# Patient Record
Sex: Female | Born: 1960 | Race: White | Hispanic: No
Health system: Midwestern US, Academic
[De-identification: ages and names within clinical notes are randomized; demographics above are authoritative.]

---

## 2017-04-20 ENCOUNTER — Ambulatory Visit: Admit: 2017-04-20 | Discharge: 2017-05-04 | Payer: MEDICAID

## 2017-04-24 ENCOUNTER — Encounter: Admit: 2017-04-24 | Discharge: 2017-04-24 | Payer: MEDICAID

## 2017-04-24 DIAGNOSIS — Z923 Personal history of irradiation: ICD-10-CM

## 2017-04-24 DIAGNOSIS — C3412 Malignant neoplasm of upper lobe, left bronchus or lung: Principal | ICD-10-CM

## 2017-04-24 DIAGNOSIS — J439 Emphysema, unspecified: ICD-10-CM

## 2017-04-24 MED ORDER — IOHEXOL 300 MG IODINE/ML IV SOLN
75 mL | Freq: Once | INTRAVENOUS | 0 refills | Status: CP
Start: 2017-04-24 — End: ?
  Administered 2017-04-24: 14:00:00 75 mL via INTRAVENOUS

## 2017-04-24 MED ORDER — SODIUM CHLORIDE 0.9 % IJ SOLN
50 mL | Freq: Once | INTRAVENOUS | 0 refills | Status: CP
Start: 2017-04-24 — End: ?
  Administered 2017-04-24: 14:00:00 50 mL via INTRAVENOUS

## 2017-04-29 ENCOUNTER — Encounter: Admit: 2017-04-29 | Discharge: 2017-04-29 | Payer: MEDICAID

## 2017-04-29 DIAGNOSIS — C3490 Malignant neoplasm of unspecified part of unspecified bronchus or lung: Principal | ICD-10-CM

## 2017-04-30 ENCOUNTER — Ambulatory Visit: Admit: 2017-04-30 | Discharge: 2017-05-01 | Payer: MEDICAID

## 2017-04-30 ENCOUNTER — Encounter: Admit: 2017-04-30 | Discharge: 2017-04-30 | Payer: MEDICAID

## 2017-04-30 DIAGNOSIS — H903 Sensorineural hearing loss, bilateral: Principal | ICD-10-CM

## 2017-04-30 DIAGNOSIS — C3490 Malignant neoplasm of unspecified part of unspecified bronchus or lung: ICD-10-CM

## 2017-04-30 DIAGNOSIS — H9190 Unspecified hearing loss, unspecified ear: ICD-10-CM

## 2017-04-30 DIAGNOSIS — M549 Dorsalgia, unspecified: ICD-10-CM

## 2017-04-30 DIAGNOSIS — N2889 Other specified disorders of kidney and ureter: ICD-10-CM

## 2017-04-30 DIAGNOSIS — R51 Headache: ICD-10-CM

## 2017-04-30 DIAGNOSIS — I10 Essential (primary) hypertension: Secondary | ICD-10-CM

## 2017-04-30 DIAGNOSIS — J9611 Chronic respiratory failure with hypoxia: ICD-10-CM

## 2017-04-30 DIAGNOSIS — J449 Chronic obstructive pulmonary disease, unspecified: ICD-10-CM

## 2017-04-30 DIAGNOSIS — G960 Cerebrospinal fluid leak: ICD-10-CM

## 2017-04-30 DIAGNOSIS — M1990 Unspecified osteoarthritis, unspecified site: ICD-10-CM

## 2017-04-30 DIAGNOSIS — G4730 Sleep apnea, unspecified: ICD-10-CM

## 2017-04-30 DIAGNOSIS — H9211 Otorrhea, right ear: ICD-10-CM

## 2017-04-30 DIAGNOSIS — J019 Acute sinusitis, unspecified: ICD-10-CM

## 2017-04-30 DIAGNOSIS — Z720 Tobacco use: ICD-10-CM

## 2017-04-30 DIAGNOSIS — F329 Major depressive disorder, single episode, unspecified: ICD-10-CM

## 2017-04-30 DIAGNOSIS — H9393 Unspecified disorder of ear, bilateral: ICD-10-CM

## 2017-04-30 DIAGNOSIS — Z923 Personal history of irradiation: ICD-10-CM

## 2017-04-30 DIAGNOSIS — H6983 Other specified disorders of Eustachian tube, bilateral: Principal | ICD-10-CM

## 2017-04-30 DIAGNOSIS — J45909 Unspecified asthma, uncomplicated: ICD-10-CM

## 2017-04-30 DIAGNOSIS — D3502 Benign neoplasm of left adrenal gland: ICD-10-CM

## 2017-04-30 DIAGNOSIS — E119 Type 2 diabetes mellitus without complications: ICD-10-CM

## 2017-04-30 DIAGNOSIS — H9011 Conductive hearing loss, unilateral, right ear, with unrestricted hearing on the contralateral side: ICD-10-CM

## 2017-04-30 MED ORDER — CIPROFLOXACIN-HYDROCORTISONE 0.2-1 % OT DRPS
3 [drp] | Freq: Three times a day (TID) | OTIC | 3 refills | 15.00000 days | Status: AC
Start: 2017-04-30 — End: ?

## 2017-04-30 NOTE — Progress Notes
chief complaint           HPI:  Paige Duran is a 56 y.o. 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  She has new complaints today of fullness and decreased hearing in the right ear.     Paige Duran is s/p right ethmoid CSF leak repair, nasal cavity free mucosal graft, and septoplasty on 12/05/16. She had sinus surgery in [place], [place] in summer 2017. 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 She is in remission.     Today her symptoms include aural fullness.     She is doing nasal saline irrigations less than daily.  No nasal symptoms.  Feels goofd otherwise. No crusting or infection      The following portions of the patient's history were reviewed and updated as appropriate: allergies, current medications, past family history, past medical history, past social history, past surgical history and problem list.    ROS above reviewed      Current Outpatient Prescriptions:   ???  acetaminophen (TYLENOL) 500 mg tablet, Take 1 tablet by mouth every 4 hours as needed. Max of 4,000 mg of acetaminophen in 24 hours., Disp: 40 tablet, Rfl: 0  ???  albuterol (PROAIR HFA) 90 mcg/actuation inhaler, Inhale 2 puffs by mouth into the lungs every 6 hours as needed for Wheezing or Shortness of Breath. Shake well before use., Disp: , Rfl:   ???  ARIPiprazole (ABILIFY) 20 mg tablet, Take 20 mg by mouth daily., Disp: , Rfl:   ???  busPIRone (BUSPAR) 30 mg tablet, Take 1 tablet by mouth twice daily., Disp: , Rfl:   ???  duloxetine DR (CYMBALTA) 60 mg capsule, Take 60 mg by mouth daily., Disp: , Rfl:   ???  hydroCHLOROthiazide (HYDRODIURIL) 25 mg tablet, Take 25 mg by mouth every morning., Disp: , Rfl:   ???  HYDROcodone/acetaminophen(+) (NORCO) 10/325 mg tablet, Take 1 tablet by mouth twice daily Earliest Fill Date: 12/07/16, Disp: 30 tablet, Rfl: 0  ???  loratadine (CLARITIN) 10 mg tablet, Take 10 mg by mouth every morning., Disp: , Rfl: ???  losartan (COZAAR) 50 mg tablet, Take 50 mg by mouth daily., Disp: , Rfl:   ???  metFORMIN (GLUCOPHAGE) 1,000 mg tablet, Take 1,000 mg by mouth twice daily with meals., Disp: , Rfl:   ???  mupirocin (BACTROBAN) 2 % topical ointment, Add inch of ointment to saline rinse bottle and mix well. Irrigate through both nostrils BID for 1 week, Disp: 22 g, Rfl: 3  ???  QUEtiapine (SEROQUEL) 100 mg tablet, Take 300 mg by mouth at bedtime daily. Patient is in the process of titrating up to full dose listed above, Disp: , Rfl:   ???  ranitidine(+) (ZANTAC) 300 mg tablet, Take 300 mg by mouth daily., Disp: , Rfl:   ???  sodium chloride/aloe vera (AYR SALINE) topical gel, Please use every 1-2 hours while awake., Disp: 1 each, Rfl: 12  ???  SYMBICORT 80-4.5 mcg/actuation inhalation, Inhale 2 puffs by mouth into the lungs twice daily., Disp: , Rfl: 5  ???  tiotropium (SPIRIVA) 18 mcg capsule for inhaler, Place 1 capsule into inhaler and inhale into lungs as directed daily., Disp: 30 capsule, Rfl: 11  ???  varenicline (CHANTIX CONTINUING MONTH BOX) 1 mg tablet, Take 1 mg by mouth twice daily., Disp: , Rfl:     General    Vitals:    04/30/17 0818  BP: 138/64   Pulse: 90     General:  Well-developed, well-nourished  Communication and Voice:  Clear pitch and clarity, age appropriate    Head and Face  Inspection:  Normocephalic and atraumatic without masses or lesions  Palpation:  Facial skeleton intact without bony stepoffs, no sinus tenderness  Salivary Glands:  No masses or tenderness  Facial Strength:  Facial motility symmetric and full bilaterally             Left -  House-Brackman Grade 1/6             Right - House-Brackman Grade 1/6    ENT  External nose:  No scar or anatomic deformity  Internal Nose:  Septum intact and midline.  No edema, polyps, or rhinorrhea  Lips, Teeth, and gums:  Mucosa and teeth intact and viable  TMJ:  No pain to palpation with full mobility  Oral cavity/oropharynx:  No erythema or exudate with non-obstructive tonsils  Nasopharynx:  No masses or lesions with intact mucosa  Hypopharynx:  Intact mucosa without pooling of secretions  Larynx:  Full true vocal cord mobility without lesions or masses    Neck  Neck and Trachea:  Midline trachea without mass or lesion  Thyroid:  No mass or nodularity  Lymphatics:  No lymphadenopathy    Respiratory  Respiratory effort:  Equal inspiration & expiration without use of accessory muscles. No stridor    Cardiovascular  Peripheral Vascular:  Warm extremities with equal pulses    Eyes  Nystagmus: None  EOM: Equal extraocular motion bilaterally    Neuro/Psych/Balance  Orientation: Patient oriented to person, place, and time  Affect: Appropriate mood and affect  Gait: Intact with no imbalance  Cranial nerves: II-XII are intact    Ear  External canal: Left - Canal is patent with intact skin                             Right - Canal is patent with intact skin  Tympanic Membranes: Left - Clear and mobile, T-tube in place.                                             Right - Clear and mobile, T-tube in place, some evidence of potential infection.  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 The following was observed:    Right Nasal Cavity and Paranasal Sinuses: CSF leak site looks fully healed. No evidence of current leak.  Mucosa normalized    Left Nasal Cavity and Paranasal Sinuses: No masses or purulence    Septum: Midline  Other:    The patient tolerated the procedure well.            Impression/Plan:    Thank you for allowing me to see your patient in consultation.  Based on my exam and review of the patients records, the following is my impression and plan:    1. CSF leak - repaired    2. Hearing loss    Obtain audiogram. Will refer to Dr. Jennette Dubin for potential infection of her right ear today.  She is fully healed from her right skull base repair and will see me back prn    F/u: As needed.    Heloise Purpura, M.D.    Professor and Villa Herb, M.D. Chairman  Department of Otolaryngology-Head and Neck Surgery  [facility]       In the presence of Neldon Newport, MD,  I have taken down these notes, Cormac Prosser, Scribe. 04/30/2017 8:27 AM

## 2017-04-30 NOTE — Progress Notes
Date of Service: 04/30/2017    Subjective:             Paige Duran is a 56 y.o. female.    History of Present Illness  Ear Eval from Dr Rhona Leavens.    Tubes in both ears.  Placed by MD in [place] several years ago.  Retired [employer] treasurer.   Unable to hear since then.     Lots of ear probls over years.      Wants tube out as hearing is worse since placement.  Wears hearing aid  On left.     History of many years of drainage.  Right > left    Hx of surgery - CSF leak.  Well healed.         Review of Systems   Constitutional: Negative.    HENT: Positive for ear discharge and ear pain.    Eyes: Negative.    Respiratory: Negative.    Cardiovascular: Negative.    Gastrointestinal: Negative.    Endocrine: Negative.    Genitourinary: Negative.    Musculoskeletal: Negative.    Skin: Negative.    Allergic/Immunologic: Negative.    Neurological: Negative.    Hematological: Negative.    Psychiatric/Behavioral: Negative.          Objective:         ??? acetaminophen (TYLENOL) 500 mg tablet Take 1 tablet by mouth every 4 hours as needed. Max of 4,000 mg of acetaminophen in 24 hours.   ??? albuterol (PROAIR HFA) 90 mcg/actuation inhaler Inhale 2 puffs by mouth into the lungs every 6 hours as needed for Wheezing or Shortness of Breath. Shake well before use.   ??? ARIPiprazole (ABILIFY) 20 mg tablet Take 20 mg by mouth daily.   ??? busPIRone (BUSPAR) 30 mg tablet Take 1 tablet by mouth twice daily.   ??? duloxetine DR (CYMBALTA) 60 mg capsule Take 60 mg by mouth daily.   ??? hydroCHLOROthiazide (HYDRODIURIL) 25 mg tablet Take 25 mg by mouth every morning.   ??? HYDROcodone/acetaminophen(+) (NORCO) 10/325 mg tablet Take 1 tablet by mouth twice daily Earliest Fill Date: 12/07/16   ??? loratadine (CLARITIN) 10 mg tablet Take 10 mg by mouth every morning.   ??? losartan (COZAAR) 50 mg tablet Take 50 mg by mouth daily.   ??? metFORMIN (GLUCOPHAGE) 1,000 mg tablet Take 1,000 mg by mouth twice daily with meals. ??? mupirocin (BACTROBAN) 2 % topical ointment Add inch of ointment to saline rinse bottle and mix well. Irrigate through both nostrils BID for 1 week   ??? QUEtiapine (SEROQUEL) 100 mg tablet Take 300 mg by mouth at bedtime daily. Patient is in the process of titrating up to full dose listed above   ??? ranitidine(+) (ZANTAC) 300 mg tablet Take 300 mg by mouth daily.   ??? sodium chloride/aloe vera (AYR SALINE) topical gel Please use every 1-2 hours while awake.   ??? SYMBICORT 80-4.5 mcg/actuation inhalation Inhale 2 puffs by mouth into the lungs twice daily.   ??? tiotropium (SPIRIVA) 18 mcg capsule for inhaler Place 1 capsule into inhaler and inhale into lungs as directed daily.   ??? varenicline (CHANTIX CONTINUING MONTH BOX) 1 mg tablet Take 1 mg by mouth twice daily.     Vitals:    04/30/17 0914   BP: 136/81   Pulse: 90   Weight: 122.5 kg (270 lb)   Height: 167.6 cm (66)     Body mass index is 43.58 kg/m???.     Physical Exam  On microscopic examination of the both ear she has bilateral T tubes in place with granulation tissue on the right.  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  Cleaned and debrided.  . Debris was removed with forcep, loop, irrigation, and suction. Assistant was involved for procedure.   silver nitrate was applied.  Left with tube with cleaning needed as well.      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  At this time she appears to have tube otorrhea on the right side at least.  I have suggested in the absence of prior treatment that we start eardrops on a regular basis. I will see her back in about a month and see how the tubes look.  I think with some maintenance she may well be able to tolerate the tubes better than she currently believes.  Will evaluate however for removal however on her next visit as was her original request.

## 2017-04-30 NOTE — Progress Notes
Date of Service: 04/30/2017    Subjective:             Paige Duran is a 56 y.o. female.    History of Present Illness       Review of Systems   Constitutional: Negative.    HENT: Positive for ear pain and hearing loss.    Eyes: Negative.    Respiratory: Negative.    Cardiovascular: Negative.    Gastrointestinal: Negative.    Endocrine: Negative.    Genitourinary: Negative.    Musculoskeletal: Negative.    Skin: Negative.    Allergic/Immunologic: Negative.    Neurological: Negative.    Hematological: Negative.    Psychiatric/Behavioral: Negative.          Objective:         ??? acetaminophen (TYLENOL) 500 mg tablet Take 1 tablet by mouth every 4 hours as needed. Max of 4,000 mg of acetaminophen in 24 hours.   ??? albuterol (PROAIR HFA) 90 mcg/actuation inhaler Inhale 2 puffs by mouth into the lungs every 6 hours as needed for Wheezing or Shortness of Breath. Shake well before use.   ??? ARIPiprazole (ABILIFY) 20 mg tablet Take 20 mg by mouth daily.   ??? busPIRone (BUSPAR) 30 mg tablet Take 1 tablet by mouth twice daily.   ??? duloxetine DR (CYMBALTA) 60 mg capsule Take 60 mg by mouth daily.   ??? hydroCHLOROthiazide (HYDRODIURIL) 25 mg tablet Take 25 mg by mouth every morning.   ??? HYDROcodone/acetaminophen(+) (NORCO) 10/325 mg tablet Take 1 tablet by mouth twice daily Earliest Fill Date: 12/07/16   ??? loratadine (CLARITIN) 10 mg tablet Take 10 mg by mouth every morning.   ??? losartan (COZAAR) 50 mg tablet Take 50 mg by mouth daily.   ??? metFORMIN (GLUCOPHAGE) 1,000 mg tablet Take 1,000 mg by mouth twice daily with meals.   ??? mupirocin (BACTROBAN) 2 % topical ointment Add inch of ointment to saline rinse bottle and mix well. Irrigate through both nostrils BID for 1 week   ??? QUEtiapine (SEROQUEL) 100 mg tablet Take 300 mg by mouth at bedtime daily. Patient is in the process of titrating up to full dose listed above   ??? ranitidine(+) (ZANTAC) 300 mg tablet Take 300 mg by mouth daily. ??? sodium chloride/aloe vera (AYR SALINE) topical gel Please use every 1-2 hours while awake.   ??? SYMBICORT 80-4.5 mcg/actuation inhalation Inhale 2 puffs by mouth into the lungs twice daily.   ??? tiotropium (SPIRIVA) 18 mcg capsule for inhaler Place 1 capsule into inhaler and inhale into lungs as directed daily.   ??? varenicline (CHANTIX CONTINUING MONTH BOX) 1 mg tablet Take 1 mg by mouth twice daily.     Vitals:    04/30/17 0818   BP: 138/64   Pulse: 90   Weight: 131.1 kg (289 lb)   Height: 165.1 cm (65)     Body mass index is 48.09 kg/m???.     Physical Exam         Assessment and Plan:

## 2017-05-28 ENCOUNTER — Encounter: Admit: 2017-05-28 | Discharge: 2017-05-28 | Payer: MEDICAID

## 2017-05-28 ENCOUNTER — Ambulatory Visit: Admit: 2017-05-28 | Discharge: 2017-05-28 | Payer: MEDICAID

## 2017-05-28 DIAGNOSIS — Z923 Personal history of irradiation: ICD-10-CM

## 2017-05-28 DIAGNOSIS — H6983 Other specified disorders of Eustachian tube, bilateral: ICD-10-CM

## 2017-05-28 DIAGNOSIS — H9393 Unspecified disorder of ear, bilateral: Principal | ICD-10-CM

## 2017-05-28 DIAGNOSIS — M549 Dorsalgia, unspecified: ICD-10-CM

## 2017-05-28 DIAGNOSIS — N2889 Other specified disorders of kidney and ureter: ICD-10-CM

## 2017-05-28 DIAGNOSIS — J9611 Chronic respiratory failure with hypoxia: ICD-10-CM

## 2017-05-28 DIAGNOSIS — J45909 Unspecified asthma, uncomplicated: ICD-10-CM

## 2017-05-28 DIAGNOSIS — H9190 Unspecified hearing loss, unspecified ear: ICD-10-CM

## 2017-05-28 DIAGNOSIS — C3490 Malignant neoplasm of unspecified part of unspecified bronchus or lung: ICD-10-CM

## 2017-05-28 DIAGNOSIS — R51 Headache: ICD-10-CM

## 2017-05-28 DIAGNOSIS — M1990 Unspecified osteoarthritis, unspecified site: ICD-10-CM

## 2017-05-28 DIAGNOSIS — E119 Type 2 diabetes mellitus without complications: ICD-10-CM

## 2017-05-28 DIAGNOSIS — J019 Acute sinusitis, unspecified: ICD-10-CM

## 2017-05-28 DIAGNOSIS — F329 Major depressive disorder, single episode, unspecified: ICD-10-CM

## 2017-05-28 DIAGNOSIS — D3502 Benign neoplasm of left adrenal gland: ICD-10-CM

## 2017-05-28 DIAGNOSIS — G4730 Sleep apnea, unspecified: ICD-10-CM

## 2017-05-28 DIAGNOSIS — I10 Essential (primary) hypertension: Secondary | ICD-10-CM

## 2017-05-28 DIAGNOSIS — G960 Cerebrospinal fluid leak: ICD-10-CM

## 2017-05-28 DIAGNOSIS — J449 Chronic obstructive pulmonary disease, unspecified: ICD-10-CM

## 2017-05-28 DIAGNOSIS — Z720 Tobacco use: ICD-10-CM

## 2017-05-28 NOTE — Progress Notes
Date of Service: 05/28/2017    Subjective:             Paige Duran is a 56 y.o. female.    History of Present Illness  FU month ago from Tube otorrhea right.  Used drops.    Drops used right.   Unable to hear.       She had a BAHA 5 years ago.      Review of Systems   Constitutional: Negative.    HENT: Positive for hearing loss.    Eyes: Negative.    Respiratory: Positive for apnea and shortness of breath.    Cardiovascular: Negative.    Gastrointestinal: Negative.    Endocrine: Negative.    Genitourinary: Negative.    Musculoskeletal: Positive for back pain.   Allergic/Immunologic: Negative.    Neurological: Negative.    Hematological: Negative.    Psychiatric/Behavioral: Negative.          Objective:         ??? acetaminophen (TYLENOL) 500 mg tablet Take 1 tablet by mouth every 4 hours as needed. Max of 4,000 mg of acetaminophen in 24 hours.   ??? albuterol (PROAIR HFA) 90 mcg/actuation inhaler Inhale 2 puffs by mouth into the lungs every 6 hours as needed for Wheezing or Shortness of Breath. Shake well before use.   ??? ARIPiprazole (ABILIFY) 20 mg tablet Take 20 mg by mouth daily.   ??? busPIRone (BUSPAR) 30 mg tablet Take 1 tablet by mouth twice daily.   ??? duloxetine DR (CYMBALTA) 60 mg capsule Take 60 mg by mouth daily.   ??? hydroCHLOROthiazide (HYDRODIURIL) 25 mg tablet Take 25 mg by mouth every morning.   ??? HYDROcodone/acetaminophen(+) (NORCO) 10/325 mg tablet Take 1 tablet by mouth twice daily Earliest Fill Date: 12/07/16   ??? loratadine (CLARITIN) 10 mg tablet Take 10 mg by mouth every morning.   ??? losartan (COZAAR) 50 mg tablet Take 50 mg by mouth daily.   ??? metFORMIN (GLUCOPHAGE) 1,000 mg tablet Take 1,000 mg by mouth twice daily with meals.   ??? mupirocin (BACTROBAN) 2 % topical ointment Add inch of ointment to saline rinse bottle and mix well. Irrigate through both nostrils BID for 1 week   ??? QUEtiapine (SEROQUEL) 100 mg tablet Take 300 mg by mouth at bedtime daily. Patient is in the process of titrating up to full dose listed above   ??? ranitidine(+) (ZANTAC) 300 mg tablet Take 300 mg by mouth daily.   ??? sodium chloride/aloe vera (AYR SALINE) topical gel Please use every 1-2 hours while awake.   ??? SYMBICORT 80-4.5 mcg/actuation inhalation Inhale 2 puffs by mouth into the lungs twice daily.   ??? tiotropium (SPIRIVA) 18 mcg capsule for inhaler Place 1 capsule into inhaler and inhale into lungs as directed daily.   ??? varenicline (CHANTIX CONTINUING MONTH BOX) 1 mg tablet Take 1 mg by mouth twice daily.     Vitals:    05/28/17 0826   BP: 138/84   Pulse: 73   Weight: 124.3 kg (274 lb)   Height: 167.6 cm (66)     Body mass index is 44.22 kg/m???.     Physical Exam  On microscopic examination of the both ear she has dry tubes in both ears.  Some scant debris right.  Much better.     June bilateral sensorineural hearing loss with no significant   air bone gap.  BAHA site clean and dry right side.       Assessment and  Plan:  Tube otorrhea is resolved.  She will maintain regular follow up with Nori Riis for tube maintenance. Marland Kitchen   Hearing aid evaluation as wears aid on right side with poorer word discrimination  And nothing on left.   Not sure BAHA of much value at this point given sensorineural hearing loss.

## 2017-07-22 ENCOUNTER — Encounter: Admit: 2017-07-22 | Discharge: 2017-07-22 | Payer: MEDICAID

## 2017-07-22 DIAGNOSIS — M1990 Unspecified osteoarthritis, unspecified site: ICD-10-CM

## 2017-07-22 DIAGNOSIS — J9611 Chronic respiratory failure with hypoxia: ICD-10-CM

## 2017-07-22 DIAGNOSIS — J449 Chronic obstructive pulmonary disease, unspecified: ICD-10-CM

## 2017-07-22 DIAGNOSIS — I10 Essential (primary) hypertension: Principal | ICD-10-CM

## 2017-07-22 DIAGNOSIS — J45909 Unspecified asthma, uncomplicated: ICD-10-CM

## 2017-07-22 DIAGNOSIS — R51 Headache: ICD-10-CM

## 2017-07-22 DIAGNOSIS — G4730 Sleep apnea, unspecified: ICD-10-CM

## 2017-07-22 DIAGNOSIS — Z720 Tobacco use: ICD-10-CM

## 2017-07-22 DIAGNOSIS — G894 Chronic pain syndrome: ICD-10-CM

## 2017-07-22 DIAGNOSIS — H9190 Unspecified hearing loss, unspecified ear: ICD-10-CM

## 2017-07-22 DIAGNOSIS — G960 Cerebrospinal fluid leak: ICD-10-CM

## 2017-07-22 DIAGNOSIS — D3502 Benign neoplasm of left adrenal gland: ICD-10-CM

## 2017-07-22 DIAGNOSIS — J019 Acute sinusitis, unspecified: ICD-10-CM

## 2017-07-22 DIAGNOSIS — N2889 Other specified disorders of kidney and ureter: ICD-10-CM

## 2017-07-22 DIAGNOSIS — Z923 Personal history of irradiation: ICD-10-CM

## 2017-07-22 DIAGNOSIS — C3490 Malignant neoplasm of unspecified part of unspecified bronchus or lung: ICD-10-CM

## 2017-07-22 DIAGNOSIS — E119 Type 2 diabetes mellitus without complications: ICD-10-CM

## 2017-07-22 DIAGNOSIS — F319 Bipolar disorder, unspecified: ICD-10-CM

## 2017-07-22 DIAGNOSIS — M549 Dorsalgia, unspecified: ICD-10-CM

## 2017-08-13 ENCOUNTER — Encounter: Admit: 2017-08-13 | Discharge: 2017-08-13 | Payer: MEDICAID

## 2017-08-13 DIAGNOSIS — Z923 Personal history of irradiation: ICD-10-CM

## 2017-08-13 DIAGNOSIS — I10 Essential (primary) hypertension: Principal | ICD-10-CM

## 2017-08-13 DIAGNOSIS — J9611 Chronic respiratory failure with hypoxia: ICD-10-CM

## 2017-08-13 DIAGNOSIS — F319 Bipolar disorder, unspecified: ICD-10-CM

## 2017-08-13 DIAGNOSIS — Z720 Tobacco use: ICD-10-CM

## 2017-08-13 DIAGNOSIS — G894 Chronic pain syndrome: ICD-10-CM

## 2017-08-13 DIAGNOSIS — R51 Headache: ICD-10-CM

## 2017-08-13 DIAGNOSIS — G960 Cerebrospinal fluid leak: ICD-10-CM

## 2017-08-13 DIAGNOSIS — J449 Chronic obstructive pulmonary disease, unspecified: ICD-10-CM

## 2017-08-13 DIAGNOSIS — J019 Acute sinusitis, unspecified: ICD-10-CM

## 2017-08-13 DIAGNOSIS — J45909 Unspecified asthma, uncomplicated: ICD-10-CM

## 2017-08-13 DIAGNOSIS — M549 Dorsalgia, unspecified: ICD-10-CM

## 2017-08-13 DIAGNOSIS — H9190 Unspecified hearing loss, unspecified ear: ICD-10-CM

## 2017-08-13 DIAGNOSIS — E119 Type 2 diabetes mellitus without complications: ICD-10-CM

## 2017-08-13 DIAGNOSIS — D3502 Benign neoplasm of left adrenal gland: ICD-10-CM

## 2017-08-13 DIAGNOSIS — M1990 Unspecified osteoarthritis, unspecified site: ICD-10-CM

## 2017-08-13 DIAGNOSIS — C3490 Malignant neoplasm of unspecified part of unspecified bronchus or lung: ICD-10-CM

## 2017-08-13 DIAGNOSIS — G4730 Sleep apnea, unspecified: ICD-10-CM

## 2017-08-13 DIAGNOSIS — N2889 Other specified disorders of kidney and ureter: ICD-10-CM

## 2017-08-20 ENCOUNTER — Ambulatory Visit: Admit: 2017-08-20 | Discharge: 2017-09-04 | Payer: MEDICAID

## 2017-08-20 DIAGNOSIS — C3412 Malignant neoplasm of upper lobe, left bronchus or lung: Secondary | ICD-10-CM

## 2017-08-29 ENCOUNTER — Encounter: Admit: 2017-08-29 | Discharge: 2017-08-29 | Payer: MEDICAID

## 2017-08-29 DIAGNOSIS — C3490 Malignant neoplasm of unspecified part of unspecified bronchus or lung: Principal | ICD-10-CM

## 2017-08-29 LAB — POC GLUCOSE: Lab: 96 mg/dL (ref 70–100)

## 2017-08-29 MED ORDER — RP DX F-18 FDG MCI
10 | Freq: Once | INTRAVENOUS | 0 refills | Status: CP
Start: 2017-08-29 — End: ?
  Administered 2017-08-29: 18:00:00 11.8 via INTRAVENOUS

## 2017-09-02 ENCOUNTER — Encounter: Admit: 2017-09-02 | Discharge: 2017-09-02 | Payer: MEDICAID

## 2017-09-02 DIAGNOSIS — F319 Bipolar disorder, unspecified: ICD-10-CM

## 2017-09-02 DIAGNOSIS — J9611 Chronic respiratory failure with hypoxia: ICD-10-CM

## 2017-09-02 DIAGNOSIS — K118 Other diseases of salivary glands: ICD-10-CM

## 2017-09-02 DIAGNOSIS — D3502 Benign neoplasm of left adrenal gland: ICD-10-CM

## 2017-09-02 DIAGNOSIS — C3490 Malignant neoplasm of unspecified part of unspecified bronchus or lung: ICD-10-CM

## 2017-09-02 DIAGNOSIS — G894 Chronic pain syndrome: ICD-10-CM

## 2017-09-02 DIAGNOSIS — G960 Cerebrospinal fluid leak: ICD-10-CM

## 2017-09-02 DIAGNOSIS — N2889 Other specified disorders of kidney and ureter: ICD-10-CM

## 2017-09-02 DIAGNOSIS — M1990 Unspecified osteoarthritis, unspecified site: ICD-10-CM

## 2017-09-02 DIAGNOSIS — J019 Acute sinusitis, unspecified: ICD-10-CM

## 2017-09-02 DIAGNOSIS — M549 Dorsalgia, unspecified: ICD-10-CM

## 2017-09-02 DIAGNOSIS — E119 Type 2 diabetes mellitus without complications: ICD-10-CM

## 2017-09-02 DIAGNOSIS — G4730 Sleep apnea, unspecified: ICD-10-CM

## 2017-09-02 DIAGNOSIS — Z720 Tobacco use: ICD-10-CM

## 2017-09-02 DIAGNOSIS — I10 Essential (primary) hypertension: Secondary | ICD-10-CM

## 2017-09-02 DIAGNOSIS — H9190 Unspecified hearing loss, unspecified ear: ICD-10-CM

## 2017-09-02 DIAGNOSIS — Z923 Personal history of irradiation: ICD-10-CM

## 2017-09-02 DIAGNOSIS — E041 Nontoxic single thyroid nodule: ICD-10-CM

## 2017-09-02 DIAGNOSIS — J45909 Unspecified asthma, uncomplicated: ICD-10-CM

## 2017-09-02 DIAGNOSIS — R51 Headache: ICD-10-CM

## 2017-09-02 DIAGNOSIS — J449 Chronic obstructive pulmonary disease, unspecified: ICD-10-CM

## 2017-09-03 ENCOUNTER — Encounter: Admit: 2017-09-03 | Discharge: 2017-09-03 | Payer: MEDICAID

## 2017-09-03 ENCOUNTER — Ambulatory Visit: Admit: 2017-09-03 | Discharge: 2017-09-03 | Payer: MEDICAID

## 2017-09-03 DIAGNOSIS — M1990 Unspecified osteoarthritis, unspecified site: ICD-10-CM

## 2017-09-03 DIAGNOSIS — H9190 Unspecified hearing loss, unspecified ear: ICD-10-CM

## 2017-09-03 DIAGNOSIS — E041 Nontoxic single thyroid nodule: ICD-10-CM

## 2017-09-03 DIAGNOSIS — K118 Other diseases of salivary glands: ICD-10-CM

## 2017-09-03 DIAGNOSIS — D3502 Benign neoplasm of left adrenal gland: ICD-10-CM

## 2017-09-03 DIAGNOSIS — C3490 Malignant neoplasm of unspecified part of unspecified bronchus or lung: ICD-10-CM

## 2017-09-03 DIAGNOSIS — J45909 Unspecified asthma, uncomplicated: ICD-10-CM

## 2017-09-03 DIAGNOSIS — F319 Bipolar disorder, unspecified: ICD-10-CM

## 2017-09-03 DIAGNOSIS — M549 Dorsalgia, unspecified: ICD-10-CM

## 2017-09-03 DIAGNOSIS — G894 Chronic pain syndrome: ICD-10-CM

## 2017-09-03 DIAGNOSIS — R51 Headache: ICD-10-CM

## 2017-09-03 DIAGNOSIS — I10 Essential (primary) hypertension: Principal | ICD-10-CM

## 2017-09-03 DIAGNOSIS — Z923 Personal history of irradiation: ICD-10-CM

## 2017-09-03 DIAGNOSIS — J449 Chronic obstructive pulmonary disease, unspecified: ICD-10-CM

## 2017-09-03 DIAGNOSIS — N2889 Other specified disorders of kidney and ureter: ICD-10-CM

## 2017-09-03 DIAGNOSIS — E119 Type 2 diabetes mellitus without complications: ICD-10-CM

## 2017-09-03 DIAGNOSIS — J9611 Chronic respiratory failure with hypoxia: ICD-10-CM

## 2017-09-03 DIAGNOSIS — Z720 Tobacco use: ICD-10-CM

## 2017-09-03 DIAGNOSIS — G960 Cerebrospinal fluid leak: ICD-10-CM

## 2017-09-03 DIAGNOSIS — G4730 Sleep apnea, unspecified: ICD-10-CM

## 2017-09-03 DIAGNOSIS — J019 Acute sinusitis, unspecified: ICD-10-CM

## 2017-09-03 NOTE — Progress Notes
Date:  09/03/2017       Paige Duran is a 56 y.o. female.     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  She completed radiation therapy in our department 02/18/2017 and notes that she has not had any problems recently.  She did have a PET scan performed on 08/29/2017.  This demonstrated 2 small hypermetabolic nodules in or adjacent to the parotid in addition the patient was noted to have the 1.3 cm irregular nodule in the left upper lobe of the lung.  There are no new suspicious lesions seen.  In addition the patient is noted to have small hypermetabolic nodule involving the inferior left thyroid lobe.  The differential includes a small thyroid adenoma or thyroid primary.  I discussed this with the patient.  To the best of her knowledge this has not been investigated, and I recommend that we have an ultrasound performed.  She refuses to have it done at the [facility] but will consent to have it done closer to home.  She will call me when the scan has been performed.    She is accompanied to clinic today by her significant other.  She does not have any new complaints.    There is no change in her family history.         History of Present Illness       Review of Systems   All other systems reviewed and are negative.  The patient completed a paper copy of a 14 system review. I reviewed this with the patient and it is as listed. It is the institution policy to destroy these paper copies.       Objective:         ??? acetaminophen (TYLENOL) 500 mg tablet Take 1 tablet by mouth every 4 hours as needed. Max of 4,000 mg of acetaminophen in 24 hours. ??? albuterol (PROAIR HFA) 90 mcg/actuation inhaler Inhale 2 puffs by mouth into the lungs every 6 hours as needed for Wheezing or Shortness of Breath. Shake well before use.   ??? ARIPiprazole (ABILIFY) 20 mg tablet Take 20 mg by mouth daily.   ??? busPIRone (BUSPAR) 30 mg tablet Take 1 tablet by mouth twice daily.   ??? cetirizine (ZYRTEC) 10 mg tablet Take 10 mg by mouth every morning.   ??? duloxetine DR (CYMBALTA) 60 mg capsule Take 60 mg by mouth daily.   ??? gabapentin (NEURONTIN) 100 mg capsule Take 200 mg by mouth twice daily.   ??? hydroCHLOROthiazide (HYDRODIURIL) 25 mg tablet Take 25 mg by mouth every morning.   ??? losartan (COZAAR) 50 mg tablet Take 50 mg by mouth daily.   ??? melatonin 5 mg tab Take 5 mg by mouth at bedtime daily.   ??? meloxicam (MOBIC) 15 mg tablet Take 15 mg by mouth daily.   ??? metFORMIN (GLUCOPHAGE) 1,000 mg tablet Take 1,000 mg by mouth twice daily with meals.   ??? mupirocin (BACTROBAN) 2 % topical ointment Add inch of ointment to saline rinse bottle and mix well. Irrigate through both nostrils BID for 1 week   ??? QUEtiapine (SEROQUEL) 100 mg tablet Take 300  mg by mouth at bedtime daily. Patient is in the process of titrating up to full dose listed above   ??? ranitidine(+) (ZANTAC) 300 mg tablet Take 300 mg by mouth daily.   ??? sodium chloride/aloe vera (AYR SALINE) topical gel Please use every 1-2 hours while awake.   ??? SYMBICORT 80-4.5 mcg/actuation inhalation Inhale 2 puffs by mouth into the lungs twice daily.   ??? tiotropium (SPIRIVA) 18 mcg capsule for inhaler Place 1 capsule into inhaler and inhale into lungs as directed daily.   ??? varenicline (CHANTIX CONTINUING MONTH BOX) 1 mg tablet Take 1 mg by mouth twice daily.     Vitals:    09/03/17 1020 09/03/17 1022   BP: 109/73    Pulse: 76    Resp: 20    Temp: 36.7 ???C (98 ???F)    TempSrc: Oral Oral   SpO2: 92%    Weight: 123.6 kg (272 lb 6.4 oz) 123.6 kg (272 lb 6.4 oz)   Height: 167.6 cm (66) 167.6 cm (66)     Body mass index is 43.97 kg/m???. Pain Score: Zero        KARNOFSKY PERFORMANCE SCORE:  90% Able to carry on normal activity; minor signs of disease    PET/CT NECK, CHEST, ABDOMEN AND PELVIS    CLINICAL HISTORY: ???Female, 56 years old. Malignant neoplasm of lung. Prior   radiation therapy.    RADIOPHARMACEUTICAL: ???11.8 mCi F-18 Fluorodeoxyglucose (FDG) IV.    TECHNIQUE: ???Beginning approximately 73 minutes after tracer   administration, routine whole body PET/CT imaging was performed from the   level of the base of the skull to the upper thighs. ???PET images were   reviewed in standard orthogonal projections. ???Low dose non-contrast CT   imaging was performed for attenuation correction and localization   purposes.     BLOOD GLUCOSE LEVEL AT THE TIME OF RADIOPHARMACEUTICAL ADMINISTRATION: ???96   mg/dl    COMPARISON: 52/84/1324    FINDINGS:     The current mean hepatic SUV is 2.0    Head/Neck: 2 small hypermetabolic nodules are noted either within or   adjacent to the tail the right parotid gland with a maximal SUV of 4.3 as   visualized on images 58 and 61. There is a small hypermetabolic focus   involving the very inferior left thyroid lobe with a maximal SUV of 5.3.    Chest: Again noted is a 1.3 cm slightly irregular nodule within the left   upper lobe on CT image 102. This is mildly hypermetabolic with a maximal   SUV of 3.1. No new suspicious lesions are seen within the chest. Again   noted are calcified granulomas within the left upper lobe. No significant   hypermetabolic lymphadenopathy.    Abdomen/Pelvis: Extensive uptake throughout the bowel consistent with   patient's history of metformin use. No suspicious hypermetabolic lesions   are noted within the abdomen or pelvis.    Osseous Structures: No suspicious hypermetabolic osseous lesions are seen.    Additional significant low dose CT findings: Diffuse hepatic steatosis.   Gastric LAP-BAND in place.    Uncorrected PET images: ???The uncorrected PET images demonstrate no   additional abnormality. IMPRESSION    ???    1. 1.3 cm left upper lobe irregular pulmonary nodule is unchanged in size   from 04/24/2017. This demonstrates persistent increased FDG uptake. The   patient's radiation therapy was reportedly completed in April 2018.   Therefore, the intensity of uptake is suspicious for residual  active   tumor. Persistent inflammatory changes related to the radiation therapy is   also consideration but felt to be less likely. Either biopsy or short-term   follow-up is recommended.  2. Small hypermetabolic nodule involving the inferior left thyroid lobe.   The differential includes a small thyroid adenoma or primary thyroid   malignancy. Correlation with thyroid ultrasound and possible biopsy is   recommended.  3. 2 small hypermetabolic nodules either within or adjacent to the tail of   the right parotid gland. These may represent small reactive lymph nodes.   However, attention on short-term follow-up is recommended.     Physical Exam demonstrates middle-aged female in no acute distress she does not have any palpable cervical or supraclavicular adenopathy present.  I do not palpate a thyroid nodule.  Lungs are clear to auscultation.          Assessment and Plan:  Paige Duran is recovering nicely following completion of radiation therapy in our department.  At this time I recommend an ultrasound of the thyroid.  Also recommend close follow-up of the chest I requested another PET scan to that end.  She does have some abnormality in the region of the parotid.    I reviewed these things in detail with the patient and her significant other.  She is comfortable with this plan.           Survivorship was discussed with the patient, and documents provided. At the completion of our discussion, there were no questions nor concerns.   Treatment Summary for Primary malignant neoplasm of left upper lobe of lung [facility])     Selinda Michaels, RN  09/03/2017 10:26 AM      Cancer Treatment Summary Provided by Selinda Michaels, RN on 03/07/2017       General Information   Patient Name Paige Duran   Patient ID [id number]   Phone [phone] (home)    Date of birth 11-11-1960   Age 45 y.o.   Support Contact Extended Emergency Contact Information  Primary Emergency Contact: Despina [place]  Home Phone: [phone]  Relation: Daughter  Secondary Emergency Contact: Hollie [place]  Home Phone: [phone]  Relation: Significant Other         Care Team   Patient Care Team:  Gwenette Greet, MD as PCP - General (Family Medicine)  Bryson Dames, MD (Cardiothoracic Surgery)  Lucretia Field, MD (Pulmonary Disease)      Cancer Diagnosis Information   Symptoms/Signs Routine chest x-ray on 08/23/2016 as part of a workup for chest pain, and shortness of breath.  The x-ray revealed a nodule in the left upper lobe of lung which was thought to be a calcified granuloma. 12/06/16 CT chest confirmed small semisolid nodule in the LUL.    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 HX EAR TUBES  2017   ??? SINUS SURGERY  2017   ??? CSF LEAK REPAIR Bilateral 12/05/2016  REPAIR CEREBRAL SPINAL FLUID LEAK, ETHMOID performed by Neldon Newport, MD at [facility] OR/Periop   ??? SINUS SURGERY Bilateral 12/05/2016    FUNCTIONAL ENDOSCOPY SINUS SURGERY IMAGE-GUIDED performed by Neldon Newport, MD at [facility] OR/Periop   ??? TISSUE TRANSFER Right 12/05/2016    FLAP PEDICLE HEAD/NECK performed by Neldon Newport, MD at [facility] OR/Periop ??? LUNG BIOPSY Left 12/07/2016    LUL bx + adenocarcinoma. TTF-1 +.   ??? COCHLEAR IMPLANT Right    ??? CYSTOSCOPY      with bladder stimulator   ??? HYSTERECTOMY     ??? 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  ??? Medically Inoperable.  ??? 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  ???         Background Information   Family History/predisposing conditions Family History   Problem Relation Age of Onset   ??? Diabetes Father       Warden/ranger N/A   Social History Social History   Substance Use Topics   ??? Smoking status: Former Smoker     Packs/day: 2.00     Years: 40.00     Quit date: 11/13/2016   ??? Smokeless tobacco: Never Used   ??? Alcohol use No            Treatment Summary   Radiation Therapy   Start Date 01/25/17   Stop Date 02/18/17   Dose 5000 cGy   Location L lung   Additional information SBRT x5 fractions      [No treatment plan]         Lifetime Dosage   Lifetime Dose Tracking:   No doses have been documented on this patient for the following tracked chemicals: mitomycin, epirubicin, doxorubicin, idarubicin, bleomycin, daunorubicin, mitoxantrone, vincristine, doxorubicin HCl pegylated liposomal, daunorubicin citrate liposomal            Follow-Up & Survivorship Care   Future Appointments  Date Time Provider Department Center   03/13/2017 3:30 PM [facility]   03/13/2017 4:00 PM Lucretia Field, MD [facility] IM   03/19/2017 11:30 AM Lenoria Farrier, MD [facility] Rad[facility]   04/30/2017 8:00 AM Neldon Newport, MD [facility] ENT        Possible Post Treatment Side Effects Symptoms Your Risk Level   Platinum late effects Problems with hearing, balance, ringing in the ears, changes in urination N/A   Cardiotoxcity  Chest pain, palpatations, irregular heart beat, tiring easily, swelling in legs and ankles, difficulty breathing N/A   Dyspnea Breathing problems Increased Risk Chemotherapy induced peripheral neuropathy (CIPN) Numbness, tingling, pins and needles feeling in fingers and toes N/A   Arthralgia, Myalgia Muscle or joint pain N/A   Pain In one area or wide spread N/A   Lymphedema Arm swelling N/A       Preventive Screening Guidelines for Healthy Adults  Getting preventive care is one of the most important steps you can take to manage your health. That's because when a condition is diagnosed early, it is usually easier to treat. And regular checkups can help you and your doctor identify lifestyle changes you can make to avoid certain conditions.    Please see the screening guidelines below to see if you're up-to-date.  Routine Checkups 18-29 years 30-39 years 40-49 years 50-64 years 65+ years   Includes personal history; blood pressure; body mass index (BMI); physical exam; preventive screening; and  counseling Annually for  ages 87???21     Annually Annually    Every 1???3 years, depending on risk factors2     Cancer Screenings        Colorectal Cancer Not routine except for patients at high risk2 Colonoscopy at age 36 and then every 10 years, or annual fecal occult blood test (FOBT) plus sigmoidoscopy every 5 years, or sigmoidoscopy every 5 years, or double-contrast barium enema every 5 years   Skin Cancer Periodic total skin exams every 3 years at discretion of clinician Annual total skin exam at discretion of clinician   Breast Cancer  Annual clinical breast exam and monthly self-exam     Annual mammography  at discretion  of clinician Annual  mammography Annual mammography  at discretion  of clinician   Cervical Cancer  Initiate Pap test at 3 years after first sexual intercourse, or by age 65 every 1-3 years,3 depending on risk factors2   Other Recommended Screenings        Body Mass Index (BMI) At discretion of clinician (can be screened annually for overweight and eating disorders, consult the CDC's growth and BMI charts) Blood Pressure (Hypertension) At every acute/nonacute medical encounter and at least once every 2 years   Cholesterol Every 5 years or more often at discretion of clinician   Diabetes (Type 2)   Every 3 years, beginning at age 81 or more often and beginning  at a younger age at discretion of clinician   Bone Mass Density (BMD) Test    Consider your risk factors, discuss with you  clinician. BMD testing for all post-menopausal  women who have one or more risk factors for  osteoporosis fractures. BMD test once,  or more often at  discretion of clinician   Infectious Disease Screening        Sexually Transmitted Infections  (Chlamydia, Gonorrhea, Syphilis, and HPV) Annual screenings for sexually active patients under 25; annually for patients age 95 and over if at risk2  HPV is for age 37 and under, if not previously vaccinated.   Sensory Screenings        Eye Exam for Glaucoma At least once. Every 3???5 years if at risk2 Every 2???4 years Every 1???2 years   Hearing and Vision Assessment At discretion of clinician   Immunizations        Tetanus, Diphtheria (Td) 3 doses if not previously immunized. Booster every 10 years   Influenza Every year if at high risk2 Annually   Pneumococcal If at high risk2 and not previously immunized Once after age 95,  even if previously  vaccinated   Meningococcal (Meningitis) 1 or more doses if not previously immunized, depending on risk factors and other indicators2   Varicella (Chicken Pox) 2 doses given at or after age 43 if susceptible2

## 2017-09-04 DIAGNOSIS — Z08 Encounter for follow-up examination after completed treatment for malignant neoplasm: Principal | ICD-10-CM

## 2017-09-04 DIAGNOSIS — C3412 Malignant neoplasm of upper lobe, left bronchus or lung: ICD-10-CM

## 2017-09-16 ENCOUNTER — Encounter: Admit: 2017-09-16 | Discharge: 2017-09-16 | Payer: MEDICAID

## 2017-09-16 DIAGNOSIS — H9190 Unspecified hearing loss, unspecified ear: ICD-10-CM

## 2017-09-16 DIAGNOSIS — Z923 Personal history of irradiation: ICD-10-CM

## 2017-09-16 DIAGNOSIS — J45909 Unspecified asthma, uncomplicated: ICD-10-CM

## 2017-09-16 DIAGNOSIS — G894 Chronic pain syndrome: ICD-10-CM

## 2017-09-16 DIAGNOSIS — E119 Type 2 diabetes mellitus without complications: ICD-10-CM

## 2017-09-16 DIAGNOSIS — J449 Chronic obstructive pulmonary disease, unspecified: ICD-10-CM

## 2017-09-16 DIAGNOSIS — M549 Dorsalgia, unspecified: ICD-10-CM

## 2017-09-16 DIAGNOSIS — G4730 Sleep apnea, unspecified: ICD-10-CM

## 2017-09-16 DIAGNOSIS — C3490 Malignant neoplasm of unspecified part of unspecified bronchus or lung: ICD-10-CM

## 2017-09-16 DIAGNOSIS — D3502 Benign neoplasm of left adrenal gland: ICD-10-CM

## 2017-09-16 DIAGNOSIS — Z720 Tobacco use: ICD-10-CM

## 2017-09-16 DIAGNOSIS — I10 Essential (primary) hypertension: Principal | ICD-10-CM

## 2017-09-16 DIAGNOSIS — E041 Nontoxic single thyroid nodule: ICD-10-CM

## 2017-09-16 DIAGNOSIS — R51 Headache: ICD-10-CM

## 2017-09-16 DIAGNOSIS — J9611 Chronic respiratory failure with hypoxia: ICD-10-CM

## 2017-09-16 DIAGNOSIS — J019 Acute sinusitis, unspecified: ICD-10-CM

## 2017-09-16 DIAGNOSIS — F319 Bipolar disorder, unspecified: ICD-10-CM

## 2017-09-16 DIAGNOSIS — M1990 Unspecified osteoarthritis, unspecified site: ICD-10-CM

## 2017-09-16 DIAGNOSIS — G960 Cerebrospinal fluid leak: ICD-10-CM

## 2017-09-16 DIAGNOSIS — N2889 Other specified disorders of kidney and ureter: ICD-10-CM

## 2017-09-16 DIAGNOSIS — C3412 Malignant neoplasm of upper lobe, left bronchus or lung: Principal | ICD-10-CM

## 2017-09-16 DIAGNOSIS — K118 Other diseases of salivary glands: ICD-10-CM

## 2017-09-16 NOTE — Telephone Encounter
Patient called for results of her thyroid US done locally 09/05/2017. [facility] for report. Dr Burnett Harry reviewed and wants patient to follow with her PCP for future scans regarding bilat thyroid nodules. Patient verbalized understanding and encouraged to contact the PCP to make sure they received the report that was CC'd to them from the hospital and make a f/u appt.

## 2017-11-20 ENCOUNTER — Ambulatory Visit: Admit: 2017-11-20 | Discharge: 2017-12-05 | Payer: MEDICAID

## 2017-11-27 ENCOUNTER — Encounter: Admit: 2017-11-27 | Discharge: 2017-11-27 | Payer: MEDICAID

## 2017-11-27 DIAGNOSIS — C3490 Malignant neoplasm of unspecified part of unspecified bronchus or lung: Principal | ICD-10-CM

## 2017-11-28 ENCOUNTER — Encounter: Admit: 2017-11-28 | Discharge: 2017-11-28 | Payer: MEDICAID

## 2017-11-28 DIAGNOSIS — F319 Bipolar disorder, unspecified: ICD-10-CM

## 2017-11-28 DIAGNOSIS — G894 Chronic pain syndrome: ICD-10-CM

## 2017-11-28 DIAGNOSIS — J449 Chronic obstructive pulmonary disease, unspecified: ICD-10-CM

## 2017-11-28 DIAGNOSIS — M549 Dorsalgia, unspecified: ICD-10-CM

## 2017-11-28 DIAGNOSIS — M1990 Unspecified osteoarthritis, unspecified site: ICD-10-CM

## 2017-11-28 DIAGNOSIS — H9190 Unspecified hearing loss, unspecified ear: ICD-10-CM

## 2017-11-28 DIAGNOSIS — E041 Nontoxic single thyroid nodule: ICD-10-CM

## 2017-11-28 DIAGNOSIS — D3502 Benign neoplasm of left adrenal gland: ICD-10-CM

## 2017-11-28 DIAGNOSIS — G960 Cerebrospinal fluid leak: ICD-10-CM

## 2017-11-28 DIAGNOSIS — Z720 Tobacco use: ICD-10-CM

## 2017-11-28 DIAGNOSIS — K118 Other diseases of salivary glands: ICD-10-CM

## 2017-11-28 DIAGNOSIS — J019 Acute sinusitis, unspecified: ICD-10-CM

## 2017-11-28 DIAGNOSIS — Z923 Personal history of irradiation: ICD-10-CM

## 2017-11-28 DIAGNOSIS — I10 Essential (primary) hypertension: Principal | ICD-10-CM

## 2017-11-28 DIAGNOSIS — E119 Type 2 diabetes mellitus without complications: ICD-10-CM

## 2017-11-28 DIAGNOSIS — G4730 Sleep apnea, unspecified: ICD-10-CM

## 2017-11-28 DIAGNOSIS — C3490 Malignant neoplasm of unspecified part of unspecified bronchus or lung: ICD-10-CM

## 2017-11-28 DIAGNOSIS — N2889 Other specified disorders of kidney and ureter: ICD-10-CM

## 2017-11-28 DIAGNOSIS — R51 Headache: ICD-10-CM

## 2017-11-28 DIAGNOSIS — J9611 Chronic respiratory failure with hypoxia: ICD-10-CM

## 2017-11-28 DIAGNOSIS — J45909 Unspecified asthma, uncomplicated: ICD-10-CM

## 2018-02-06 ENCOUNTER — Encounter: Admit: 2018-02-06 | Discharge: 2018-02-06 | Payer: MEDICAID

## 2018-02-06 DIAGNOSIS — I10 Essential (primary) hypertension: Principal | ICD-10-CM

## 2018-02-06 DIAGNOSIS — K118 Other diseases of salivary glands: ICD-10-CM

## 2018-02-06 DIAGNOSIS — E119 Type 2 diabetes mellitus without complications: ICD-10-CM

## 2018-02-06 DIAGNOSIS — H9190 Unspecified hearing loss, unspecified ear: ICD-10-CM

## 2018-02-06 DIAGNOSIS — D3502 Benign neoplasm of left adrenal gland: ICD-10-CM

## 2018-02-06 DIAGNOSIS — E041 Nontoxic single thyroid nodule: ICD-10-CM

## 2018-02-06 DIAGNOSIS — J019 Acute sinusitis, unspecified: ICD-10-CM

## 2018-02-06 DIAGNOSIS — M1990 Unspecified osteoarthritis, unspecified site: ICD-10-CM

## 2018-02-06 DIAGNOSIS — J9611 Chronic respiratory failure with hypoxia: ICD-10-CM

## 2018-02-06 DIAGNOSIS — M549 Dorsalgia, unspecified: ICD-10-CM

## 2018-02-06 DIAGNOSIS — J45909 Unspecified asthma, uncomplicated: ICD-10-CM

## 2018-02-06 DIAGNOSIS — N2889 Other specified disorders of kidney and ureter: ICD-10-CM

## 2018-02-06 DIAGNOSIS — G4730 Sleep apnea, unspecified: ICD-10-CM

## 2018-02-06 DIAGNOSIS — C3490 Malignant neoplasm of unspecified part of unspecified bronchus or lung: ICD-10-CM

## 2018-02-06 DIAGNOSIS — Z720 Tobacco use: ICD-10-CM

## 2018-02-06 DIAGNOSIS — G894 Chronic pain syndrome: ICD-10-CM

## 2018-02-06 DIAGNOSIS — F319 Bipolar disorder, unspecified: ICD-10-CM

## 2018-02-06 DIAGNOSIS — G960 Cerebrospinal fluid leak: ICD-10-CM

## 2018-02-06 DIAGNOSIS — J449 Chronic obstructive pulmonary disease, unspecified: ICD-10-CM

## 2018-02-06 DIAGNOSIS — R51 Headache: ICD-10-CM

## 2018-02-06 DIAGNOSIS — Z923 Personal history of irradiation: ICD-10-CM

## 2018-03-19 ENCOUNTER — Encounter: Admit: 2018-03-19 | Discharge: 2018-03-19 | Payer: MEDICAID

## 2018-03-19 ENCOUNTER — Ambulatory Visit: Admit: 2018-03-19 | Discharge: 2018-03-19 | Payer: MEDICAID

## 2018-03-19 DIAGNOSIS — N2889 Other specified disorders of kidney and ureter: ICD-10-CM

## 2018-03-19 DIAGNOSIS — G960 Cerebrospinal fluid leak: ICD-10-CM

## 2018-03-19 DIAGNOSIS — K118 Other diseases of salivary glands: ICD-10-CM

## 2018-03-19 DIAGNOSIS — I469 Cardiac arrest, cause unspecified: Secondary | ICD-10-CM

## 2018-03-19 DIAGNOSIS — M549 Dorsalgia, unspecified: ICD-10-CM

## 2018-03-19 DIAGNOSIS — T884XXA Failed or difficult intubation, initial encounter: ICD-10-CM

## 2018-03-19 DIAGNOSIS — R51 Headache: ICD-10-CM

## 2018-03-19 DIAGNOSIS — M1990 Unspecified osteoarthritis, unspecified site: ICD-10-CM

## 2018-03-19 DIAGNOSIS — G4730 Sleep apnea, unspecified: ICD-10-CM

## 2018-03-19 DIAGNOSIS — I10 Essential (primary) hypertension: Principal | ICD-10-CM

## 2018-03-19 DIAGNOSIS — J019 Acute sinusitis, unspecified: ICD-10-CM

## 2018-03-19 DIAGNOSIS — G894 Chronic pain syndrome: ICD-10-CM

## 2018-03-19 DIAGNOSIS — J45909 Unspecified asthma, uncomplicated: ICD-10-CM

## 2018-03-19 DIAGNOSIS — Z923 Personal history of irradiation: ICD-10-CM

## 2018-03-19 DIAGNOSIS — D3502 Benign neoplasm of left adrenal gland: ICD-10-CM

## 2018-03-19 DIAGNOSIS — J449 Chronic obstructive pulmonary disease, unspecified: ICD-10-CM

## 2018-03-19 DIAGNOSIS — H9190 Unspecified hearing loss, unspecified ear: ICD-10-CM

## 2018-03-19 DIAGNOSIS — E041 Nontoxic single thyroid nodule: ICD-10-CM

## 2018-03-19 DIAGNOSIS — C3490 Malignant neoplasm of unspecified part of unspecified bronchus or lung: ICD-10-CM

## 2018-03-19 DIAGNOSIS — E119 Type 2 diabetes mellitus without complications: ICD-10-CM

## 2018-03-19 DIAGNOSIS — J9611 Chronic respiratory failure with hypoxia: ICD-10-CM

## 2018-03-19 DIAGNOSIS — F319 Bipolar disorder, unspecified: ICD-10-CM

## 2018-03-19 DIAGNOSIS — Z720 Tobacco use: ICD-10-CM

## 2018-03-19 LAB — PHOSPHORUS: Lab: 7.9 mg/dL — ABNORMAL HIGH (ref 2.0–4.5)

## 2018-03-19 LAB — BLOOD GASES, ARTERIAL
Lab: 7.2 MMOL/L — ABNORMAL LOW (ref 7.35–7.45)
Lab: 90 % — ABNORMAL LOW (ref 95–99)
Lab: 27 MMOL/L (ref 21–28)
Lab: 3.7 MMOL/L
Lab: 66 mmHg — ABNORMAL LOW (ref 80–100)
Lab: 67 mmHg — ABNORMAL HIGH (ref 35–45)
Lab: 7.3 — ABNORMAL LOW (ref 7.35–7.45)
Lab: 91 % — ABNORMAL LOW (ref 95–99)
Lab: 27 MMOL/L (ref 21–28)
Lab: 64 mmHg — ABNORMAL HIGH (ref 35–45)
Lab: 64 mmHg — ABNORMAL LOW (ref 80–100)
Lab: 7.3 — ABNORMAL LOW (ref 7.35–7.45)
Lab: 90 % — ABNORMAL LOW (ref 95–99)
Lab: 26 MMOL/L (ref 21–28)
Lab: 59 mmHg — ABNORMAL HIGH (ref 35–45)
Lab: 7.3 mg/dL — ABNORMAL LOW (ref 7.35–7.45)
Lab: 72 mmHg — ABNORMAL LOW (ref 80–100)
Lab: 94 % — ABNORMAL LOW (ref >60)
Lab: 27 MMOL/L (ref 21–28)
Lab: 3.1 MMOL/L
Lab: 63 mmHg — ABNORMAL HIGH (ref 35–45)
Lab: 7.3 — ABNORMAL LOW (ref 7.35–7.45)
Lab: 81 mmHg (ref 80–100)
Lab: 95 % (ref 95–99)

## 2018-03-19 LAB — BASIC METABOLIC PANEL
Lab: 0.9 mg/dL (ref 0.4–1.00)
Lab: 101 MMOL/L (ref 98–110)
Lab: 140 MMOL/L (ref 137–147)
Lab: 15 mg/dL (ref 7–25)
Lab: 295 mg/dL — ABNORMAL HIGH (ref 70–100)
Lab: 30 MMOL/L (ref 21–30)
Lab: 4 MMOL/L (ref 3.5–5.1)
Lab: 8.8 mg/dL (ref 8.5–10.6)
Lab: 9 (ref 3–12)
Lab: >60 mL/min (ref >60)
Lab: >60 mL/min (ref >60)

## 2018-03-19 LAB — PROTIME INR (PT): Lab: 1.1 M/UL — ABNORMAL HIGH (ref 0.8–1.2)

## 2018-03-19 LAB — URINALYSIS DIPSTICK
Lab: NEGATIVE
Lab: NEGATIVE

## 2018-03-19 LAB — POC BLOOD GAS VEN
Lab: 10 MMOL/L (ref 20–32)
Lab: 23 MMOL/L (ref 3.5–5.3)
Lab: 55 mmHg — ABNORMAL HIGH (ref <5.0)
Lab: 6.8 mg/dL — CL (ref >=60)
Lab: 9 % — ABNORMAL LOW (ref 55–71)

## 2018-03-19 LAB — COMPREHENSIVE METABOLIC PANEL
Lab: 0.4 mg/dL — ABNORMAL HIGH (ref 0.3–1.2)
Lab: 145 MMOL/L — ABNORMAL HIGH (ref 137–147)
Lab: 23 mg/dL — ABNORMAL HIGH (ref 3–12)
Lab: 24 MMOL/L — ABNORMAL HIGH (ref 21–30)
Lab: 3.3 g/dL — ABNORMAL LOW (ref 3.5–5.0)
Lab: 32 U/L — ABNORMAL HIGH (ref 7–56)
Lab: 48 mL/min — ABNORMAL LOW (ref >60)
Lab: 58 mL/min — ABNORMAL LOW (ref >60)
Lab: 1 mg/dL — ABNORMAL HIGH (ref >=60)
Lab: 1.3 mg/dL — ABNORMAL HIGH (ref 0.3–1.2)
Lab: 100 MMOL/L (ref 98–110)
Lab: 139 MMOL/L (ref 137–147)
Lab: 17 mg/dL (ref <=20)
Lab: 3.5 g/dL (ref <10.4)
Lab: 31 MMOL/L — ABNORMAL HIGH (ref 21–30)
Lab: 327 mg/dL — ABNORMAL HIGH (ref 70–100)
Lab: 4.1 MMOL/L (ref 3.5–5.1)
Lab: 55 mL/min — ABNORMAL LOW (ref >60)
Lab: 58 U/L — ABNORMAL HIGH (ref 7–56)
Lab: 58 U/L — ABNORMAL HIGH (ref <5.7)
Lab: 6.1 g/dL (ref 6.0–8.0)
Lab: 8 fL (ref 3–12)
Lab: 8.7 mg/dL — ABNORMAL HIGH (ref >=60)
Lab: 80 U/L (ref 25–110)
Lab: >60 mL/min (ref >60)

## 2018-03-19 LAB — POC GLUCOSE
Lab: 149 mg/dL — ABNORMAL HIGH (ref 70–100)
Lab: 284 mg/dL — ABNORMAL HIGH (ref 70–100)
Lab: 255 mg/dL — ABNORMAL HIGH (ref 70–100)
Lab: 275 mg/dL — ABNORMAL HIGH (ref 70–100)

## 2018-03-19 LAB — GRAM STAIN

## 2018-03-19 LAB — FIBRINOGEN: Lab: 217 mg/dL (ref 200–400)

## 2018-03-19 LAB — POC BLOOD GAS ARTERIAL
Lab: 1 MMOL/L
Lab: 100 % — ABNORMAL HIGH (ref 95–99)
Lab: 112 mmHg — ABNORMAL HIGH (ref 35–45)
Lab: 258 mmHg — ABNORMAL HIGH (ref 80–100)
Lab: 7 — CL (ref 7.35–7.45)
Lab: 7.1 FL — CL (ref 7.35–7.45)
Lab: 90 mmHg — ABNORMAL HIGH (ref 35–45)

## 2018-03-19 LAB — TROPONIN-I
Lab: 0 ng/mL — ABNORMAL HIGH (ref 0.0–0.05)
Lab: 0.3 ng/mL — ABNORMAL HIGH (ref 0.0–0.05)
Lab: 0.4 ng/mL — ABNORMAL HIGH (ref 0.0–0.05)

## 2018-03-19 LAB — POC HEMATOCRIT
Lab: 16 g/dL — ABNORMAL HIGH (ref 12.0–15.0)
Lab: 20 % — ABNORMAL LOW (ref 36–45)
Lab: 48 % — ABNORMAL HIGH (ref 36–45)
Lab: 6.8 g/dL — ABNORMAL LOW (ref 12.0–15.0)
Lab: 17 g/dL — ABNORMAL HIGH (ref 12.0–15.0)
Lab: 52 % — ABNORMAL HIGH (ref 36–45)

## 2018-03-19 LAB — RVP VIRAL PANEL PCR

## 2018-03-19 LAB — POC SODIUM
Lab: 142 MMOL/L (ref 137–147)
Lab: 150 MMOL/L — ABNORMAL HIGH (ref 137–147)
Lab: 142 MMOL/L (ref 137–147)

## 2018-03-19 LAB — BNP (B-TYPE NATRIURETIC PEPTI): Lab: 103 pg/mL — ABNORMAL HIGH (ref 0–100)

## 2018-03-19 LAB — PTT (APTT): Lab: 32 s (ref 24.0–36.5)

## 2018-03-19 LAB — IONIZED CALCIUM: Lab: 1.3 MMOL/L — ABNORMAL HIGH (ref 1.0–1.3)

## 2018-03-19 LAB — POC IONIZED CALCIUM
Lab: 0.4 MMOL/L — ABNORMAL LOW (ref 1.0–1.3)
Lab: 1.3 MMOL/L — ABNORMAL HIGH (ref 1.0–1.3)
Lab: 1.2 MMOL/L (ref 1.0–1.3)

## 2018-03-19 LAB — LACTIC ACID (BG - RAPID LACTATE)
Lab: 15 MMOL/L — ABNORMAL HIGH (ref 0.5–2.0)
Lab: 2.6 MMOL/L — ABNORMAL HIGH (ref 0.5–2.0)

## 2018-03-19 LAB — CBC AND DIFF: Lab: 25 10*3/uL — ABNORMAL HIGH (ref 4.5–11.0)

## 2018-03-19 LAB — POC POTASSIUM
Lab: 2.8 MMOL/L — ABNORMAL LOW (ref 3.5–5.1)
Lab: 5.2 MMOL/L — ABNORMAL HIGH (ref 3.5–5.1)
Lab: 2.9 MMOL/L — ABNORMAL LOW (ref 3.5–5.1)

## 2018-03-19 LAB — TSH WITH FREE T4 REFLEX: Lab: 3.9 uU/mL — ABNORMAL HIGH (ref 0.35–5.00)

## 2018-03-19 LAB — URINALYSIS, MICROSCOPIC

## 2018-03-19 LAB — PROCALCITONIN

## 2018-03-19 LAB — CBC: Lab: 55 10*3/uL — ABNORMAL HIGH (ref 4.5–11.0)

## 2018-03-19 LAB — POC CREATININE, RAD: Lab: 1 mg/dL (ref 0.4–1.00)

## 2018-03-19 LAB — MAGNESIUM: Lab: 2.4 mg/dL — ABNORMAL LOW (ref 1.6–2.6)

## 2018-03-19 MED ORDER — PERFLUTREN LIPID MICROSPHERES 1.1 MG/ML IV SUSP
1-20 mL | Freq: Once | INTRAVENOUS | 0 refills | Status: CP | PRN
Start: 2018-03-19 — End: ?
  Administered 2018-03-19: 22:00:00 2 mL via INTRAVENOUS

## 2018-03-19 MED ORDER — SODIUM BICARBONATE 8.4 % (1 MEQ/ML) IV SYRG
0 refills | Status: CP
Start: 2018-03-19 — End: ?
  Administered 2018-03-19 (×2): 50 meq via INTRAVENOUS

## 2018-03-19 MED ORDER — METHYLPREDNISOLONE SOD SUC(PF) 125 MG/2 ML IJ SOLR
40 mg | Freq: Once | INTRAVENOUS | 0 refills | Status: CP
Start: 2018-03-19 — End: ?

## 2018-03-19 MED ORDER — ALBUTEROL SULFATE 2.5 MG/0.5 ML IN NEBU
2.5 mg | RESPIRATORY_TRACT | 0 refills | Status: DC | PRN
Start: 2018-03-19 — End: 2018-03-21
  Administered 2018-03-19 – 2018-03-21 (×12): 2.5 mg via RESPIRATORY_TRACT

## 2018-03-19 MED ORDER — NOREPINEPHRINE IV DRIP (QUAD CONC)
.01-.3 ug/kg/min | INTRAVENOUS | 0 refills | Status: DC
Start: 2018-03-19 — End: 2018-03-21
  Administered 2018-03-19 (×2): 0.22 ug/kg/min via INTRAVENOUS

## 2018-03-19 MED ORDER — PIPERACILLIN/TAZOBACTAM 3.375 G/NS IVPB (MB+)
3.375 g | INTRAVENOUS | 0 refills | Status: DC
Start: 2018-03-19 — End: 2018-03-24
  Administered 2018-03-19 – 2018-03-24 (×41): 3.375 g via INTRAVENOUS

## 2018-03-19 MED ORDER — DIPHENHYDRAMINE HCL 50 MG/ML IJ SOLN
50 mg | INTRAVENOUS | 0 refills | Status: DC
Start: 2018-03-19 — End: 2018-03-20
  Administered 2018-03-19 – 2018-03-20 (×4): 50 mg via INTRAVENOUS

## 2018-03-19 MED ORDER — SODIUM CHLORIDE 0.9 % IJ SOLN
50 mL | Freq: Once | INTRAVENOUS | 0 refills | Status: CP
Start: 2018-03-19 — End: ?
  Administered 2018-03-19: 14:00:00 50 mL via INTRAVENOUS

## 2018-03-19 MED ORDER — EPINEPHRINE 16MCG/ML IV DRIP (STD CONC)
0.05-1 ug/kg/min | INTRAVENOUS | 0 refills | Status: DC
Start: 2018-03-19 — End: 2018-03-19

## 2018-03-19 MED ORDER — FAMOTIDINE (PF) 20 MG/2 ML IV SOLN
20 mg | Freq: Two times a day (BID) | INTRAVENOUS | 0 refills | Status: DC
Start: 2018-03-19 — End: 2018-03-20
  Administered 2018-03-19 – 2018-03-20 (×3): 20 mg via INTRAVENOUS

## 2018-03-19 MED ORDER — INSULIN ASPART 100 UNIT/ML SC FLEXPEN
0-24 [IU] | Freq: Every day | SUBCUTANEOUS | 0 refills | Status: DC
Start: 2018-03-19 — End: 2018-03-24

## 2018-03-19 MED ORDER — IOHEXOL 350 MG IODINE/ML IV SOLN
100 mL | Freq: Once | INTRAVENOUS | 0 refills | Status: CP
Start: 2018-03-19 — End: ?

## 2018-03-19 MED ORDER — IPRATROPIUM BROMIDE 0.02 % IN SOLN
0.5 mg | RESPIRATORY_TRACT | 0 refills | Status: DC | PRN
Start: 2018-03-19 — End: 2018-03-21
  Administered 2018-03-19 – 2018-03-21 (×12): 0.5 mg via RESPIRATORY_TRACT

## 2018-03-19 MED ORDER — DEXTRAN 70-HYPROMELLOSE 0.1-0.3 % OP DROP
1 [drp] | OPHTHALMIC | 0 refills | Status: DC | PRN
Start: 2018-03-19 — End: 2018-03-26
  Administered 2018-03-20: 09:00:00 1 [drp] via OPHTHALMIC

## 2018-03-19 MED ORDER — MEPERIDINE (PF) 25 MG/ML IJ SYRG
25 mg | INTRAVENOUS | 0 refills | Status: DC | PRN
Start: 2018-03-19 — End: 2018-03-24
  Administered 2018-03-19: 22:00:00 25 mg via INTRAVENOUS

## 2018-03-19 MED ORDER — CALCIUM CHLORIDE 100 MG/ML (10 %) IV SYRG
0 refills | Status: CP
Start: 2018-03-19 — End: ?
  Administered 2018-03-19: 14:00:00 1 g via INTRAVENOUS

## 2018-03-19 MED ORDER — CHLORHEXIDINE GLUCONATE 0.12 % MM MWSH
15 mL | Freq: Two times a day (BID) | 0 refills | Status: DC
Start: 2018-03-19 — End: 2018-03-24
  Administered 2018-03-19 – 2018-03-24 (×11): 15 mL

## 2018-03-19 MED ORDER — EPINEPHRINE 0.1 MG/ML IJ SYRG
0 refills | Status: CP
Start: 2018-03-19 — End: ?
  Administered 2018-03-19 (×3): 1 mg via INTRAVENOUS

## 2018-03-19 MED ORDER — EPINEPHRINE 0.1 MG/ML IJ SYRG
0 refills | Status: CP
Start: 2018-03-19 — End: ?
  Administered 2018-03-19: 14:00:00 1 mg via INTRAVENOUS

## 2018-03-19 MED ORDER — POTASSIUM CHLORIDE IN WATER 10 MEQ/50 ML IV PGBK
10 meq | INTRAVENOUS | 0 refills | Status: CP
Start: 2018-03-19 — End: ?
  Administered 2018-03-19 (×3): 10 meq via INTRAVENOUS

## 2018-03-19 MED ORDER — DEXTROSE 50 % IN WATER (D50W) IV SYRG
0 refills | Status: CP
Start: 2018-03-19 — End: ?
  Administered 2018-03-19: 14:00:00 50 mL via INTRAVENOUS

## 2018-03-19 MED ORDER — SODIUM CHLORIDE 0.9 % IV SOLP
0 refills | Status: CP
Start: 2018-03-19 — End: ?
  Administered 2018-03-19: 14:00:00 999 mL/h via INTRAVENOUS

## 2018-03-19 MED ORDER — RP DX TC-99M MEDRONATE MCI
25 | Freq: Once | INTRAVENOUS | 0 refills | Status: CP
Start: 2018-03-19 — End: ?
  Administered 2018-03-19: 13:00:00 25.9 via INTRAVENOUS

## 2018-03-19 MED ORDER — METHYLPREDNISOLONE SOD SUC(PF) 125 MG/2 ML IJ SOLR
40 mg | INTRAVENOUS | 0 refills | Status: DC
Start: 2018-03-19 — End: 2018-03-20
  Administered 2018-03-19 – 2018-03-20 (×4): 40 mg via INTRAVENOUS

## 2018-03-19 MED ORDER — INSULIN REGULAR HUMAN(#) 1 UNIT/ML IJ SYRINGE
0 refills | Status: CP
Start: 2018-03-19 — End: ?
  Administered 2018-03-19: 14:00:00 10 [IU] via INTRAVENOUS

## 2018-03-19 MED ORDER — EPINEPHRINE 0.1 MG/ML IJ SYRG
0 refills | Status: CP
Start: 2018-03-19 — End: ?
  Administered 2018-03-19 (×4): 1 mg via INTRAVENOUS

## 2018-03-19 MED ORDER — PROPOFOL 10 MG/ML IV EMUL
5-150 ug/kg/min | INTRAVENOUS | 0 refills | Status: DC
Start: 2018-03-19 — End: 2018-03-24
  Administered 2018-03-19: 23:00:00 100 ug/kg/min via INTRAVENOUS
  Administered 2018-03-19: 22:00:00 80 ug/kg/min via INTRAVENOUS
  Administered 2018-03-19: 20:00:00 40 ug/kg/min via INTRAVENOUS
  Administered 2018-03-20: 16:00:00 60 ug/kg/min via INTRAVENOUS
  Administered 2018-03-20: 01:00:00 100 ug/kg/min via INTRAVENOUS
  Administered 2018-03-20 (×3): 60 ug/kg/min via INTRAVENOUS
  Administered 2018-03-20 (×2): 90 ug/kg/min via INTRAVENOUS
  Administered 2018-03-20 (×4): 60 ug/kg/min via INTRAVENOUS
  Administered 2018-03-20: 06:00:00 80 ug/kg/min via INTRAVENOUS
  Administered 2018-03-21 (×3): 50 ug/kg/min via INTRAVENOUS
  Administered 2018-03-21: 05:00:00 60 ug/kg/min via INTRAVENOUS
  Administered 2018-03-21 (×2): 50 ug/kg/min via INTRAVENOUS
  Administered 2018-03-21: 13:00:00 40 ug/kg/min via INTRAVENOUS
  Administered 2018-03-21 (×3): 60 ug/kg/min via INTRAVENOUS
  Administered 2018-03-22 (×2): 35 ug/kg/min via INTRAVENOUS
  Administered 2018-03-22: 14:00:00 30 ug/kg/min via INTRAVENOUS
  Administered 2018-03-22: 04:00:00 50 ug/kg/min via INTRAVENOUS
  Administered 2018-03-22: 23:00:00 30 ug/kg/min via INTRAVENOUS
  Administered 2018-03-22: 02:00:00 60 ug/kg/min via INTRAVENOUS
  Administered 2018-03-22: 19:00:00 30 ug/kg/min via INTRAVENOUS
  Administered 2018-03-23 (×3): 50 ug/kg/min via INTRAVENOUS
  Administered 2018-03-23: 01:00:00 40 ug/kg/min via INTRAVENOUS
  Administered 2018-03-23: 23:00:00 15 ug/kg/min via INTRAVENOUS
  Administered 2018-03-23: 04:00:00 50 ug/kg/min via INTRAVENOUS
  Administered 2018-03-24 (×2): 15 ug/kg/min via INTRAVENOUS

## 2018-03-19 MED ORDER — FENTANYL PCA/DRIP IN NS 1000MCG/100ML
10-100 ug/h | INTRAVENOUS | 0 refills | Status: DC
Start: 2018-03-19 — End: 2018-03-24
  Administered 2018-03-19: 16:00:00 25 ug/h via INTRAVENOUS
  Administered 2018-03-20: 04:00:00 80 ug/h via INTRAVENOUS
  Administered 2018-03-20: 22:00:00 50 ug/h via INTRAVENOUS
  Administered 2018-03-21: 25 ug/h via INTRAVENOUS
  Administered 2018-03-23 (×2): 30 ug/h via INTRAVENOUS
  Administered 2018-03-24: 06:00:00 60 ug/h via INTRAVENOUS

## 2018-03-19 MED ORDER — INSULIN ASPART 100 UNIT/ML SC FLEXPEN
0-6 [IU] | Freq: Before meals | SUBCUTANEOUS | 0 refills | Status: DC
Start: 2018-03-19 — End: 2018-03-19
  Administered 2018-03-19: 17:00:00 2 [IU] via SUBCUTANEOUS

## 2018-03-19 MED ORDER — ACETAMINOPHEN 160 MG/5 ML PO SOLN
500 mg | ORAL | 0 refills | Status: DC
Start: 2018-03-19 — End: 2018-03-22
  Administered 2018-03-19 – 2018-03-22 (×9): 500 mg via ORAL

## 2018-03-19 MED ADMIN — POTASSIUM CHLORIDE IN WATER 10 MEQ/50 ML IV PGBK [11075]: 10 meq | INTRAVENOUS | @ 15:00:00 | Stop: 2018-03-19 | NDC 00338070541

## 2018-03-19 MED ADMIN — METHYLPREDNISOLONE SOD SUC(PF) 125 MG/2 ML IJ SOLR [301233]: 40 mg | INTRAVENOUS | @ 15:00:00 | Stop: 2018-03-19 | NDC 00009004725

## 2018-03-19 MED ADMIN — PROPOFOL 10 MG/ML IV EMUL [11150]: 20 ug/kg/min | INTRAVENOUS | @ 16:00:00 | Stop: 2018-03-19 | NDC 63323026965

## 2018-03-19 MED ADMIN — FENTANYL CITRATE (PF) 50 MCG/ML IJ SOLN [3037]: 50 ug | INTRAVENOUS | @ 15:00:00 | Stop: 2018-03-19 | NDC 00409909412

## 2018-03-19 MED ADMIN — MIDAZOLAM 1 MG/ML IJ SOLN [10607]: 2 mg | INTRAVENOUS | @ 15:00:00 | Stop: 2018-03-19 | NDC 00409230521

## 2018-03-20 ENCOUNTER — Inpatient Hospital Stay: Admit: 2018-03-20 | Discharge: 2018-03-20 | Payer: MEDICAID

## 2018-03-20 ENCOUNTER — Ambulatory Visit: Admit: 2018-03-20 | Discharge: 2018-04-04 | Payer: MEDICAID

## 2018-03-20 DIAGNOSIS — I469 Cardiac arrest, cause unspecified: ICD-10-CM

## 2018-03-20 LAB — TROPONIN-I
Lab: 0.2 ng/mL — ABNORMAL HIGH (ref 0.0–0.05)
Lab: 0.1 ng/mL — ABNORMAL HIGH (ref 0.0–0.05)
Lab: 0.1 ng/mL — ABNORMAL HIGH (ref 0.0–0.05)

## 2018-03-20 LAB — COMPREHENSIVE METABOLIC PANEL
Lab: 101 MMOL/L — ABNORMAL HIGH (ref 98–110)
Lab: 139 MMOL/L (ref >60)
Lab: 16 mg/dL — ABNORMAL HIGH (ref 7–25)
Lab: 8.9 mg/dL — ABNORMAL LOW (ref >60)

## 2018-03-20 LAB — PHOSPHORUS: Lab: 4 mg/dL — ABNORMAL LOW (ref 2.0–4.5)

## 2018-03-20 LAB — CBC AND DIFF: Lab: 39 K/UL — ABNORMAL HIGH (ref >60)

## 2018-03-20 LAB — POC GLUCOSE
Lab: 311 mg/dL — ABNORMAL HIGH (ref 70–100)
Lab: 317 mg/dL — ABNORMAL HIGH (ref 70–100)
Lab: 303 mg/dL — ABNORMAL HIGH (ref 70–100)
Lab: 263 mg/dL — ABNORMAL HIGH (ref 70–100)
Lab: 218 mg/dL — ABNORMAL HIGH (ref 70–100)
Lab: 221 mg/dL — ABNORMAL HIGH (ref 70–100)

## 2018-03-20 LAB — FUNGITELL: Lab: 253 U/L — ABNORMAL HIGH (ref 1.003–1.035)

## 2018-03-20 LAB — BLOOD GASES, ARTERIAL
Lab: 2.8 MMOL/L
Lab: 26 MMOL/L (ref 21–28)
Lab: 53 mmHg — ABNORMAL HIGH (ref 35–45)
Lab: 74 mmHg — ABNORMAL LOW (ref 80–100)
Lab: 94 % — ABNORMAL LOW (ref 95–99)
Lab: 7.3 (ref 7.35–7.45)
Lab: 7.3 M/UL — ABNORMAL LOW (ref 7.35–7.45)
Lab: 7.3 MMOL/L — ABNORMAL LOW (ref 7.35–7.45)
Lab: 74 mmHg — ABNORMAL LOW (ref 80–100)
Lab: 93 % — ABNORMAL LOW (ref 95–99)
Lab: 29 MMOL/L — ABNORMAL HIGH (ref 21–28)
Lab: 5.4 MMOL/L
Lab: 55 mmHg — ABNORMAL HIGH (ref 35–45)
Lab: 65 mmHg — ABNORMAL LOW (ref 80–100)
Lab: 7.3 (ref 7.35–7.45)
Lab: 91 % — ABNORMAL LOW (ref 95–99)

## 2018-03-20 LAB — CULTURE-URINE W/SENSITIVITY

## 2018-03-20 LAB — MAGNESIUM: Lab: 1.9 mg/dL — ABNORMAL LOW (ref 1.6–2.6)

## 2018-03-20 LAB — TRYPTASE: Lab: 44 mg/dL — ABNORMAL HIGH (ref 0.4–1.24)

## 2018-03-20 LAB — LDH-LACTATE DEHYDROGENASE: Lab: 573 U/L — ABNORMAL HIGH (ref 100–210)

## 2018-03-20 MED ORDER — SENNA/DOCUSATE(#) 8.8/50MG/10ML PO SOLN
10 mL | Freq: Two times a day (BID) | ORAL | 0 refills | Status: DC
Start: 2018-03-20 — End: 2018-03-22
  Administered 2018-03-20 – 2018-03-22 (×5): 10 mL via ORAL

## 2018-03-20 MED ORDER — ENOXAPARIN 40 MG/0.4 ML SC SYRG
40 mg | Freq: Once | SUBCUTANEOUS | 0 refills | Status: CP
Start: 2018-03-20 — End: ?
  Administered 2018-03-20: 19:00:00 40 mg via SUBCUTANEOUS

## 2018-03-20 MED ORDER — METHYLPREDNISOLONE SOD SUC(PF) 125 MG/2 ML IJ SOLR
40 mg | Freq: Two times a day (BID) | INTRAVENOUS | 0 refills | Status: DC
Start: 2018-03-20 — End: 2018-03-22
  Administered 2018-03-21 – 2018-03-22 (×3): 40 mg via INTRAVENOUS

## 2018-03-20 MED ORDER — NYSTATIN 100,000 UNIT/GRAM TP POWD
Freq: Two times a day (BID) | TOPICAL | 0 refills | Status: DC
Start: 2018-03-20 — End: 2018-03-26
  Administered 2018-03-20 – 2018-03-23 (×2): via TOPICAL

## 2018-03-20 MED ORDER — ENOXAPARIN 60 MG/0.6 ML SC SYRG
60 mg | Freq: Two times a day (BID) | SUBCUTANEOUS | 0 refills | Status: DC
Start: 2018-03-20 — End: 2018-03-23
  Administered 2018-03-21 – 2018-03-22 (×4): 60 mg via SUBCUTANEOUS

## 2018-03-20 MED ORDER — POLYETHYLENE GLYCOL 3350 17 GRAM PO PWPK
1 | Freq: Every day | ORAL | 0 refills | Status: DC
Start: 2018-03-20 — End: 2018-03-22
  Administered 2018-03-20 – 2018-03-22 (×3): 17 g via ORAL

## 2018-03-20 MED ORDER — FAMOTIDINE 40 MG/5 ML (8 MG/ML) PO SUSP
20 mg | Freq: Two times a day (BID) | ORAL | 0 refills | Status: DC
Start: 2018-03-20 — End: 2018-03-22
  Administered 2018-03-21 – 2018-03-22 (×4): 20 mg via ORAL

## 2018-03-20 MED ORDER — METHYLPREDNISOLONE SOD SUC(PF) 125 MG/2 ML IJ SOLR
40 mg | INTRAVENOUS | 0 refills | Status: CP
Start: 2018-03-20 — End: ?
  Administered 2018-03-20 – 2018-03-21 (×2): 40 mg via INTRAVENOUS

## 2018-03-20 MED ORDER — NPH INSULIN HUMAN RECOMB 100 UNIT/ML (3 ML) SC PEN
10 [IU] | Freq: Two times a day (BID) | SUBCUTANEOUS | 0 refills | Status: DC
Start: 2018-03-20 — End: 2018-03-24
  Administered 2018-03-20: 17:00:00 10 [IU] via SUBCUTANEOUS

## 2018-03-21 ENCOUNTER — Encounter: Admit: 2018-03-21 | Discharge: 2018-03-21 | Payer: MEDICAID

## 2018-03-21 DIAGNOSIS — I469 Cardiac arrest, cause unspecified: Secondary | ICD-10-CM

## 2018-03-21 LAB — BLOOD GASES, ARTERIAL
Lab: 31 MMOL/L — ABNORMAL HIGH (ref 21–28)
Lab: 54 mmHg — ABNORMAL HIGH (ref 35–45)
Lab: 66 mmHg — ABNORMAL LOW (ref 80–100)
Lab: 7.3 MMOL/L
Lab: 7.4 (ref 7.35–7.45)
Lab: 91 % — ABNORMAL LOW (ref 95–99)
Lab: 7.4 mg/dL — ABNORMAL HIGH (ref 7.35–7.45)
Lab: 85 mmHg (ref 80–100)

## 2018-03-21 LAB — POC GLUCOSE
Lab: 214 mg/dL — ABNORMAL HIGH (ref 70–100)
Lab: 201 mg/dL — ABNORMAL HIGH (ref 70–100)
Lab: 213 mg/dL — ABNORMAL HIGH (ref 70–100)
Lab: 188 mg/dL — ABNORMAL HIGH (ref >60)
Lab: 222 mg/dL — ABNORMAL HIGH (ref 70–100)

## 2018-03-21 LAB — CULTURE-RESP,LOWER W/SENSITIVITY: Lab: LOW mL/min — AB (ref 0–0.80)

## 2018-03-21 LAB — POTASSIUM: Lab: 4 MMOL/L (ref 3.5–5.1)

## 2018-03-21 LAB — PHOSPHORUS: Lab: 3.8 mg/dL — ABNORMAL HIGH (ref >60)

## 2018-03-21 LAB — COMPREHENSIVE METABOLIC PANEL: Lab: 138 MMOL/L — ABNORMAL LOW (ref >60)

## 2018-03-21 LAB — CBC AND DIFF: Lab: 25 K/UL — ABNORMAL HIGH (ref 4.5–11.0)

## 2018-03-21 LAB — MAGNESIUM: Lab: 2.1 mg/dL — ABNORMAL HIGH (ref 1.6–2.6)

## 2018-03-21 MED ORDER — ALBUTEROL SULFATE 2.5 MG/0.5 ML IN NEBU
2.5 mg | RESPIRATORY_TRACT | 0 refills | Status: DC | PRN
Start: 2018-03-21 — End: 2018-03-22
  Administered 2018-03-21 – 2018-03-22 (×5): 2.5 mg via RESPIRATORY_TRACT

## 2018-03-21 MED ORDER — LEVETIRACETAM 100ML IVPB
750 mg | Freq: Two times a day (BID) | INTRAVENOUS | 0 refills | Status: DC
Start: 2018-03-21 — End: 2018-03-26
  Administered 2018-03-22 – 2018-03-25 (×15): 750 mg via INTRAVENOUS

## 2018-03-21 MED ORDER — IPRATROPIUM BROMIDE 0.02 % IN SOLN
0.5 mg | RESPIRATORY_TRACT | 0 refills | Status: DC | PRN
Start: 2018-03-21 — End: 2018-03-22
  Administered 2018-03-21 – 2018-03-22 (×5): 0.5 mg via RESPIRATORY_TRACT

## 2018-03-21 MED ORDER — FUROSEMIDE 10 MG/ML IJ SOLN
40 mg | Freq: Once | INTRAVENOUS | 0 refills | Status: CP
Start: 2018-03-21 — End: ?
  Administered 2018-03-21: 21:00:00 40 mg via INTRAVENOUS

## 2018-03-22 ENCOUNTER — Encounter: Admit: 2018-03-22 | Discharge: 2018-03-22 | Payer: MEDICAID

## 2018-03-22 DIAGNOSIS — I469 Cardiac arrest, cause unspecified: ICD-10-CM

## 2018-03-22 LAB — URINALYSIS DIPSTICK REFLEX TO CULTURE: Lab: POSITIVE — AB

## 2018-03-22 LAB — MAGNESIUM: Lab: 2.2 mg/dL — ABNORMAL HIGH (ref >60)

## 2018-03-22 LAB — POC GLUCOSE
Lab: 162 mg/dL — ABNORMAL HIGH (ref 70–100)
Lab: 212 mg/dL — ABNORMAL HIGH (ref 70–100)
Lab: 191 mg/dL — ABNORMAL HIGH (ref 70–100)
Lab: 152 mg/dL — ABNORMAL HIGH (ref 70–100)
Lab: 150 mg/dL — ABNORMAL HIGH (ref 70–100)

## 2018-03-22 LAB — URINALYSIS MICROSCOPIC REFLEX TO CULTURE

## 2018-03-22 LAB — BLOOD GASES, ARTERIAL
Lab: 7.4 mg/dL — ABNORMAL HIGH (ref 7.35–7.45)
Lab: 55 mmHg — ABNORMAL HIGH (ref 35–45)
Lab: 7.4 MMOL/L — ABNORMAL LOW (ref 7.35–7.45)

## 2018-03-22 LAB — CBC AND DIFF: Lab: 20 10*3/uL — ABNORMAL HIGH (ref 4.5–11.0)

## 2018-03-22 LAB — COMPREHENSIVE METABOLIC PANEL: Lab: 139 MMOL/L — ABNORMAL LOW (ref 137–147)

## 2018-03-22 LAB — PHOSPHORUS: Lab: 4.2 mg/dL — ABNORMAL HIGH (ref 2.0–4.5)

## 2018-03-22 MED ORDER — ENOXAPARIN 300 MG/3 ML SC SOLN
140 mg | Freq: Two times a day (BID) | SUBCUTANEOUS | 0 refills | Status: DC
Start: 2018-03-22 — End: 2018-03-24
  Administered 2018-03-23 – 2018-03-24 (×4): 140 mg via SUBCUTANEOUS

## 2018-03-22 MED ORDER — FUROSEMIDE 10 MG/ML IJ SOLN
40 mg | Freq: Once | INTRAVENOUS | 0 refills | Status: CP
Start: 2018-03-22 — End: ?
  Administered 2018-03-22: 21:00:00 40 mg via INTRAVENOUS

## 2018-03-22 MED ORDER — ENOXAPARIN 150 MG/ML SC SYRG
150 mg | Freq: Two times a day (BID) | SUBCUTANEOUS | 0 refills | Status: DC
Start: 2018-03-22 — End: 2018-03-23

## 2018-03-22 MED ORDER — POLYETHYLENE GLYCOL 3350 17 GRAM PO PWPK
1 | Freq: Three times a day (TID) | GASTROSTOMY | 0 refills | Status: DC
Start: 2018-03-22 — End: 2018-03-24
  Administered 2018-03-22 – 2018-03-24 (×4): 17 g via GASTROSTOMY

## 2018-03-22 MED ORDER — SENNA/DOCUSATE(#) 8.8/50MG/10ML PO SOLN
10 mL | Freq: Two times a day (BID) | GASTROSTOMY | 0 refills | Status: DC
Start: 2018-03-22 — End: 2018-03-24
  Administered 2018-03-23 (×2): 10 mL via GASTROSTOMY

## 2018-03-22 MED ORDER — POLYETHYLENE GLYCOL 3350 17 GRAM PO PWPK
1 | Freq: Three times a day (TID) | ORAL | 0 refills | Status: DC
Start: 2018-03-22 — End: 2018-03-22

## 2018-03-22 MED ORDER — ACETAMINOPHEN 160 MG/5 ML PO SOLN
500 mg | GASTROSTOMY | 0 refills | Status: DC
Start: 2018-03-22 — End: 2018-03-25
  Administered 2018-03-22 – 2018-03-25 (×9): 500 mg via GASTROSTOMY

## 2018-03-22 MED ORDER — FAMOTIDINE 40 MG/5 ML (8 MG/ML) PO SUSP
20 mg | Freq: Two times a day (BID) | GASTROSTOMY | 0 refills | Status: DC
Start: 2018-03-22 — End: 2018-03-25
  Administered 2018-03-23 – 2018-03-25 (×6): 20 mg via GASTROSTOMY

## 2018-03-22 MED ORDER — METHYLPREDNISOLONE SOD SUC(PF) 125 MG/2 ML IJ SOLR
40 mg | INTRAVENOUS | 0 refills | Status: DC
Start: 2018-03-22 — End: 2018-03-23
  Administered 2018-03-22 – 2018-03-23 (×4): 40 mg via INTRAVENOUS

## 2018-03-22 MED ORDER — ALBUTEROL SULFATE 2.5 MG/0.5 ML IN NEBU
2.5 mg | RESPIRATORY_TRACT | 0 refills | Status: DC | PRN
Start: 2018-03-22 — End: 2018-03-25
  Administered 2018-03-22 – 2018-03-25 (×14): 2.5 mg via RESPIRATORY_TRACT

## 2018-03-22 MED ORDER — IPRATROPIUM BROMIDE 0.02 % IN SOLN
0.5 mg | RESPIRATORY_TRACT | 0 refills | Status: DC | PRN
Start: 2018-03-22 — End: 2018-03-25
  Administered 2018-03-22 – 2018-03-25 (×14): 0.5 mg via RESPIRATORY_TRACT

## 2018-03-22 MED ADMIN — SODIUM CHLORIDE 0.9 % IV SOLP [27838]: 250 mL | INTRAVENOUS | @ 04:00:00 | Stop: 2018-03-22 | NDC 00338004902

## 2018-03-23 LAB — BASIC METABOLIC PANEL
Lab: 0.9 mg/dL (ref >60)
Lab: 139 MMOL/L — ABNORMAL LOW (ref 137–147)
Lab: 239 mg/dL — ABNORMAL HIGH (ref 70–100)
Lab: 28 mg/dL — ABNORMAL HIGH (ref 7–25)
Lab: 3.3 MMOL/L — ABNORMAL LOW (ref 3.5–5.1)
Lab: 36 MMOL/L — ABNORMAL HIGH (ref >60)
Lab: 6 mg/dL — ABNORMAL LOW (ref >60)
Lab: 8.9 mg/dL (ref >60)
Lab: 97 MMOL/L — ABNORMAL LOW (ref 98–110)
Lab: >60 mL/min (ref >60)
Lab: >60 mL/min (ref >60)

## 2018-03-23 LAB — BNP (B-TYPE NATRIURETIC PEPTI): Lab: 62 pg/mL (ref 0–100)

## 2018-03-23 LAB — GRAM STAIN

## 2018-03-23 LAB — POC GLUCOSE
Lab: 185 mg/dL — ABNORMAL HIGH (ref 70–100)
Lab: 192 mg/dL — ABNORMAL HIGH (ref 70–100)
Lab: 181 mg/dL — ABNORMAL HIGH (ref 70–100)
Lab: 210 mg/dL — ABNORMAL HIGH (ref 70–100)
Lab: 219 mg/dL — ABNORMAL HIGH (ref 70–100)

## 2018-03-23 LAB — PHOSPHORUS: Lab: 3.4 mg/dL — ABNORMAL LOW (ref 2.0–4.5)

## 2018-03-23 LAB — MAGNESIUM: Lab: 2.4 mg/dL — ABNORMAL LOW (ref >60)

## 2018-03-23 LAB — COMPREHENSIVE METABOLIC PANEL: Lab: 143 MMOL/L — ABNORMAL LOW (ref 137–147)

## 2018-03-23 LAB — BLOOD GASES, ARTERIAL
Lab: 7.4 g/dL — ABNORMAL LOW (ref >60)
Lab: 7.4 mg/dL — ABNORMAL HIGH (ref 7.35–7.45)

## 2018-03-23 LAB — TRIGLYCERIDE: Lab: 327 mg/dL — ABNORMAL HIGH (ref <150)

## 2018-03-23 LAB — CBC AND DIFF: Lab: 16 K/UL — ABNORMAL HIGH (ref 4.5–11.0)

## 2018-03-23 MED ORDER — BISACODYL 10 MG RE SUPP
10 mg | RECTAL | 0 refills | Status: DC
Start: 2018-03-23 — End: 2018-03-23
  Administered 2018-03-23: 17:00:00 10 mg via RECTAL

## 2018-03-23 MED ORDER — HYDROCHLOROTHIAZIDE 25 MG PO TAB
25 mg | Freq: Every morning | GASTROSTOMY | 0 refills | Status: DC
Start: 2018-03-23 — End: 2018-03-24
  Administered 2018-03-24: 14:00:00 25 mg via GASTROSTOMY

## 2018-03-23 MED ORDER — POTASSIUM CHLORIDE IN WATER 10 MEQ/50 ML IV PGBK
10 meq | INTRAVENOUS | 0 refills | Status: CP
Start: 2018-03-23 — End: ?
  Administered 2018-03-23 – 2018-03-24 (×4): 10 meq via INTRAVENOUS

## 2018-03-23 MED ORDER — FUROSEMIDE 10 MG/ML IJ SOLN
40 mg | Freq: Once | INTRAVENOUS | 0 refills | Status: CP
Start: 2018-03-23 — End: ?
  Administered 2018-03-23: 40 mg via INTRAVENOUS

## 2018-03-23 MED ORDER — ACETAZOLAMIDE SODIUM 500 MG IJ SOLR
250 mg | Freq: Once | INTRAVENOUS | 0 refills | Status: CP
Start: 2018-03-23 — End: ?
  Administered 2018-03-23: 250 mg via INTRAVENOUS

## 2018-03-23 MED ORDER — ACETAZOLAMIDE SODIUM 500 MG IJ SOLR
250 mg | Freq: Once | INTRAVENOUS | 0 refills | Status: CP
Start: 2018-03-23 — End: ?
  Administered 2018-03-23: 16:00:00 250 mg via INTRAVENOUS

## 2018-03-23 MED ORDER — METHYLPREDNISOLONE SOD SUC(PF) 125 MG/2 ML IJ SOLR
40 mg | INTRAVENOUS | 0 refills | Status: CP
Start: 2018-03-23 — End: ?
  Administered 2018-03-23 (×2): 40 mg via INTRAVENOUS

## 2018-03-23 MED ORDER — THIAMINE HCL (VITAMIN B1) 100 MG/ML IJ SOLN
100 mg | Freq: Every day | INTRAVENOUS | 0 refills | Status: DC
Start: 2018-03-23 — End: 2018-03-24
  Administered 2018-03-23 – 2018-03-24 (×2): 100 mg via INTRAVENOUS

## 2018-03-23 MED ORDER — METHYLPREDNISOLONE SOD SUC(PF) 125 MG/2 ML IJ SOLR
40 mg | Freq: Two times a day (BID) | INTRAVENOUS | 0 refills | Status: DC
Start: 2018-03-23 — End: 2018-03-26
  Administered 2018-03-24 – 2018-03-25 (×3): 40 mg via INTRAVENOUS

## 2018-03-23 MED ORDER — FUROSEMIDE 10 MG/ML IJ SOLN
40 mg | Freq: Once | INTRAVENOUS | 0 refills | Status: CP
Start: 2018-03-23 — End: ?
  Administered 2018-03-23: 16:00:00 40 mg via INTRAVENOUS

## 2018-03-23 MED ORDER — POTASSIUM CHLORIDE 20 MEQ/15 ML PO LIQD
40 meq | Freq: Once | GASTROSTOMY | 0 refills | Status: CP
Start: 2018-03-23 — End: ?
  Administered 2018-03-23: 40 meq via GASTROSTOMY

## 2018-03-23 MED ORDER — HYDROCHLOROTHIAZIDE 25 MG PO TAB
25 mg | Freq: Every morning | ORAL | 0 refills | Status: DC
Start: 2018-03-23 — End: 2018-03-23
  Administered 2018-03-23: 17:00:00 25 mg via ORAL

## 2018-03-23 MED ORDER — DEXMEDETOMIDINE IV DRIP (STD CONC)
0.2-1 ug/kg/h | INTRAVENOUS | 0 refills | Status: DC
Start: 2018-03-23 — End: 2018-03-24
  Administered 2018-03-23 (×2): 0.2 ug/kg/h via INTRAVENOUS

## 2018-03-23 MED ORDER — MULTIVITAMIN WITH IRON-MINERAL PO LIQD
15 mL | Freq: Every day | OROGASTRIC | 0 refills | Status: DC
Start: 2018-03-23 — End: 2018-03-24
  Administered 2018-03-23 – 2018-03-24 (×2): 15 mL via OROGASTRIC

## 2018-03-24 ENCOUNTER — Inpatient Hospital Stay: Admit: 2018-03-24 | Discharge: 2018-03-24 | Payer: MEDICAID

## 2018-03-24 DIAGNOSIS — I469 Cardiac arrest, cause unspecified: ICD-10-CM

## 2018-03-24 LAB — BASIC METABOLIC PANEL: Lab: 140 MMOL/L — ABNORMAL HIGH (ref >60)

## 2018-03-24 LAB — PHOSPHORUS: Lab: 3.2 mg/dL — ABNORMAL HIGH (ref 2.0–4.5)

## 2018-03-24 LAB — POC GLUCOSE
Lab: 175 mg/dL — ABNORMAL HIGH (ref 70–100)
Lab: 164 mg/dL — ABNORMAL HIGH (ref 70–100)
Lab: 152 mg/dL — ABNORMAL HIGH (ref 70–100)
Lab: 188 mg/dL — ABNORMAL HIGH (ref 70–100)

## 2018-03-24 LAB — COMPREHENSIVE METABOLIC PANEL: Lab: 141 MMOL/L (ref 137–147)

## 2018-03-24 LAB — CBC AND DIFF: Lab: 14 K/UL — ABNORMAL HIGH (ref >60)

## 2018-03-24 LAB — MAGNESIUM: Lab: 2.6 mg/dL — ABNORMAL LOW (ref >60)

## 2018-03-24 LAB — BLOOD GASES, ARTERIAL
Lab: 7.4 M/UL — ABNORMAL LOW (ref 7.35–7.45)
Lab: 7.4 MMOL/L — ABNORMAL LOW (ref 7.35–7.45)

## 2018-03-24 MED ORDER — FENTANYL PCA/DRIP IN NS 1000MCG/100ML
10-100 ug/h | INTRAVENOUS | 0 refills | Status: DC
Start: 2018-03-24 — End: 2018-03-26
  Administered 2018-03-24 – 2018-03-25 (×3): 50 ug/h via INTRAVENOUS

## 2018-03-24 MED ORDER — POTASSIUM CHLORIDE 20 MEQ/15 ML PO LIQD
40-60 meq | NASOGASTRIC | 0 refills | Status: DC | PRN
Start: 2018-03-24 — End: 2018-03-24

## 2018-03-24 MED ORDER — ACETAZOLAMIDE SODIUM 500 MG IJ SOLR
500 mg | Freq: Once | INTRAVENOUS | 0 refills | Status: CP
Start: 2018-03-24 — End: ?
  Administered 2018-03-24: 14:00:00 500 mg via INTRAVENOUS

## 2018-03-24 MED ORDER — HYDRALAZINE 20 MG/ML IJ SOLN
10-20 mg | INTRAVENOUS | 0 refills | Status: DC | PRN
Start: 2018-03-24 — End: 2018-03-24

## 2018-03-24 MED ORDER — SENNA/DOCUSATE(#) 8.8/50MG/10ML PO SOLN
20 mL | Freq: Two times a day (BID) | GASTROSTOMY | 0 refills | Status: DC
Start: 2018-03-24 — End: 2018-03-25
  Administered 2018-03-24: 14:00:00 20 mL via GASTROSTOMY

## 2018-03-24 MED ORDER — NPH INSULIN HUMAN RECOMB 100 UNIT/ML (3 ML) SC PEN
12 [IU] | Freq: Two times a day (BID) | SUBCUTANEOUS | 0 refills | Status: DC
Start: 2018-03-24 — End: 2018-03-24

## 2018-03-24 MED ORDER — LOSARTAN 50 MG PO TAB
50 mg | Freq: Every day | ORAL | 0 refills | Status: DC
Start: 2018-03-24 — End: 2018-03-24
  Administered 2018-03-24: 17:00:00 50 mg via ORAL

## 2018-03-24 MED ORDER — FUROSEMIDE 10 MG/ML IJ SOLN
60 mg | Freq: Once | INTRAVENOUS | 0 refills | Status: CP
Start: 2018-03-24 — End: ?
  Administered 2018-03-24: 14:00:00 60 mg via INTRAVENOUS

## 2018-03-24 MED ORDER — NICARDIPINE IN NACL (ISO-OS) 20 MG/200 ML IV PGBK
5-15 mg/h | INTRAVENOUS | 0 refills | Status: DC
Start: 2018-03-24 — End: 2018-03-24
  Administered 2018-03-24: 17:00:00 5 mg/h via INTRAVENOUS

## 2018-03-24 MED ORDER — POTASSIUM CHLORIDE 20 MEQ/15 ML PO LIQD
60 meq | Freq: Once | GASTROSTOMY | 0 refills | Status: CP
Start: 2018-03-24 — End: ?
  Administered 2018-03-24: 10:00:00 60 meq via GASTROSTOMY

## 2018-03-24 MED ORDER — POTASSIUM CHLORIDE IN WATER 10 MEQ/50 ML IV PGBK
10 meq | INTRAVENOUS | 0 refills | Status: CP
Start: 2018-03-24 — End: ?
  Administered 2018-03-24 (×3): 10 meq via INTRAVENOUS

## 2018-03-24 MED ORDER — MIDAZOLAM INJ (PF) IN 0.9 % NACL 1 MG/ML IV SOLN (ADULT)
1-10 mg/h | INTRAVENOUS | 0 refills | Status: DC
Start: 2018-03-24 — End: 2018-03-26
  Administered 2018-03-24 – 2018-03-25 (×3): 1 mg/h via INTRAVENOUS

## 2018-03-24 MED ORDER — POTASSIUM CHLORIDE 20 MEQ PO TBTQ
40-60 meq | ORAL | 0 refills | Status: DC | PRN
Start: 2018-03-24 — End: 2018-03-24

## 2018-03-25 ENCOUNTER — Inpatient Hospital Stay: Admit: 2018-03-22 | Discharge: 2018-03-22 | Payer: MEDICAID

## 2018-03-25 ENCOUNTER — Inpatient Hospital Stay: Admit: 2018-03-19 | Discharge: 2018-03-19 | Payer: MEDICAID

## 2018-03-25 ENCOUNTER — Encounter: Admit: 2018-03-19 | Discharge: 2018-03-19 | Payer: MEDICAID

## 2018-03-25 ENCOUNTER — Inpatient Hospital Stay: Admit: 2018-03-19 | Discharge: 2018-04-05 | Disposition: E | Payer: MEDICAID | Admitting: Critical Care Medicine

## 2018-03-25 ENCOUNTER — Encounter: Admit: 2018-03-25 | Discharge: 2018-03-25 | Payer: MEDICAID

## 2018-03-25 ENCOUNTER — Inpatient Hospital Stay: Admit: 2018-03-21 | Discharge: 2018-03-21 | Payer: MEDICAID

## 2018-03-25 ENCOUNTER — Ambulatory Visit: Admit: 2018-03-19 | Discharge: 2018-03-19 | Payer: MEDICAID

## 2018-03-25 ENCOUNTER — Inpatient Hospital Stay: Admit: 2018-03-24 | Discharge: 2018-03-24 | Payer: MEDICAID

## 2018-03-25 DIAGNOSIS — Z91041 Radiographic dye allergy status: ICD-10-CM

## 2018-03-25 DIAGNOSIS — E875 Hyperkalemia: ICD-10-CM

## 2018-03-25 DIAGNOSIS — E785 Hyperlipidemia, unspecified: ICD-10-CM

## 2018-03-25 DIAGNOSIS — J9622 Acute and chronic respiratory failure with hypercapnia: ICD-10-CM

## 2018-03-25 DIAGNOSIS — G8929 Other chronic pain: ICD-10-CM

## 2018-03-25 DIAGNOSIS — T886XXA Anaphylactic reaction due to adverse effect of correct drug or medicament properly administered, initial encounter: Principal | ICD-10-CM

## 2018-03-25 DIAGNOSIS — G253 Myoclonus: ICD-10-CM

## 2018-03-25 DIAGNOSIS — Z6841 Body Mass Index (BMI) 40.0 and over, adult: ICD-10-CM

## 2018-03-25 DIAGNOSIS — Z66 Do not resuscitate: ICD-10-CM

## 2018-03-25 DIAGNOSIS — E1165 Type 2 diabetes mellitus with hyperglycemia: ICD-10-CM

## 2018-03-25 DIAGNOSIS — I2699 Other pulmonary embolism without acute cor pulmonale: ICD-10-CM

## 2018-03-25 DIAGNOSIS — I82431 Acute embolism and thrombosis of right popliteal vein: ICD-10-CM

## 2018-03-25 DIAGNOSIS — G931 Anoxic brain damage, not elsewhere classified: ICD-10-CM

## 2018-03-25 DIAGNOSIS — C3490 Malignant neoplasm of unspecified part of unspecified bronchus or lung: ICD-10-CM

## 2018-03-25 DIAGNOSIS — T508X5A Adverse effect of diagnostic agents, initial encounter: ICD-10-CM

## 2018-03-25 DIAGNOSIS — E041 Nontoxic single thyroid nodule: ICD-10-CM

## 2018-03-25 DIAGNOSIS — Z7984 Long term (current) use of oral hypoglycemic drugs: ICD-10-CM

## 2018-03-25 DIAGNOSIS — J9811 Atelectasis: ICD-10-CM

## 2018-03-25 DIAGNOSIS — J019 Acute sinusitis, unspecified: ICD-10-CM

## 2018-03-25 DIAGNOSIS — J9621 Acute and chronic respiratory failure with hypoxia: ICD-10-CM

## 2018-03-25 DIAGNOSIS — Z515 Encounter for palliative care: ICD-10-CM

## 2018-03-25 DIAGNOSIS — Z79899 Other long term (current) drug therapy: ICD-10-CM

## 2018-03-25 DIAGNOSIS — Z9221 Personal history of antineoplastic chemotherapy: ICD-10-CM

## 2018-03-25 DIAGNOSIS — J441 Chronic obstructive pulmonary disease with (acute) exacerbation: ICD-10-CM

## 2018-03-25 DIAGNOSIS — Z90710 Acquired absence of both cervix and uterus: ICD-10-CM

## 2018-03-25 DIAGNOSIS — E872 Acidosis: ICD-10-CM

## 2018-03-25 DIAGNOSIS — Z923 Personal history of irradiation: ICD-10-CM

## 2018-03-25 DIAGNOSIS — F419 Anxiety disorder, unspecified: ICD-10-CM

## 2018-03-25 DIAGNOSIS — E876 Hypokalemia: ICD-10-CM

## 2018-03-25 DIAGNOSIS — I468 Cardiac arrest due to other underlying condition: ICD-10-CM

## 2018-03-25 DIAGNOSIS — F17210 Nicotine dependence, cigarettes, uncomplicated: ICD-10-CM

## 2018-03-25 DIAGNOSIS — F329 Major depressive disorder, single episode, unspecified: ICD-10-CM

## 2018-03-25 DIAGNOSIS — I214 Non-ST elevation (NSTEMI) myocardial infarction: ICD-10-CM

## 2018-03-25 DIAGNOSIS — M1990 Unspecified osteoarthritis, unspecified site: ICD-10-CM

## 2018-03-25 DIAGNOSIS — E119 Type 2 diabetes mellitus without complications: ICD-10-CM

## 2018-03-25 DIAGNOSIS — I10 Essential (primary) hypertension: ICD-10-CM

## 2018-03-25 DIAGNOSIS — K118 Other diseases of salivary glands: ICD-10-CM

## 2018-03-25 DIAGNOSIS — N2889 Other specified disorders of kidney and ureter: ICD-10-CM

## 2018-03-25 DIAGNOSIS — D3502 Benign neoplasm of left adrenal gland: ICD-10-CM

## 2018-03-25 DIAGNOSIS — M549 Dorsalgia, unspecified: ICD-10-CM

## 2018-03-25 DIAGNOSIS — Z720 Tobacco use: ICD-10-CM

## 2018-03-25 DIAGNOSIS — R51 Headache: ICD-10-CM

## 2018-03-25 DIAGNOSIS — G960 Cerebrospinal fluid leak: ICD-10-CM

## 2018-03-25 DIAGNOSIS — H9190 Unspecified hearing loss, unspecified ear: ICD-10-CM

## 2018-03-25 DIAGNOSIS — G894 Chronic pain syndrome: ICD-10-CM

## 2018-03-25 DIAGNOSIS — J449 Chronic obstructive pulmonary disease, unspecified: ICD-10-CM

## 2018-03-25 DIAGNOSIS — G4730 Sleep apnea, unspecified: ICD-10-CM

## 2018-03-25 DIAGNOSIS — J9611 Chronic respiratory failure with hypoxia: ICD-10-CM

## 2018-03-25 DIAGNOSIS — F319 Bipolar disorder, unspecified: ICD-10-CM

## 2018-03-25 DIAGNOSIS — J45909 Unspecified asthma, uncomplicated: ICD-10-CM

## 2018-03-25 LAB — CULTURE-BLOOD W/SENSITIVITY

## 2018-03-25 MED ORDER — FENTANYL CITRATE (PF) 50 MCG/ML IJ SOLN
50 ug | Freq: Once | INTRAVENOUS | 0 refills | Status: DC
Start: 2018-03-25 — End: 2018-03-26

## 2018-03-25 MED ORDER — MIDAZOLAM 1 MG/ML IJ SOLN
1 mg | Freq: Once | INTRAVENOUS | 0 refills | Status: DC
Start: 2018-03-25 — End: 2018-03-26

## 2018-03-25 MED ORDER — IPRATROPIUM BROMIDE 0.02 % IN SOLN
0.5 mg | RESPIRATORY_TRACT | 0 refills | Status: DC | PRN
Start: 2018-03-25 — End: 2018-03-26

## 2018-03-25 MED ORDER — GLYCOPYRROLATE 0.2 MG/ML IJ SOLN
.4-.8 mg | INTRAVENOUS | 0 refills | Status: DC | PRN
Start: 2018-03-25 — End: 2018-03-26

## 2018-03-25 MED ORDER — ALBUTEROL SULFATE 2.5 MG/0.5 ML IN NEBU
2.5 mg | RESPIRATORY_TRACT | 0 refills | Status: DC | PRN
Start: 2018-03-25 — End: 2018-03-26

## 2018-03-25 MED ORDER — GLYCOPYRROLATE 0.2 MG/ML IJ SOLN
0.4 mg | Freq: Once | INTRAVENOUS | 0 refills | Status: CP
Start: 2018-03-25 — End: ?
  Administered 2018-03-25: 21:00:00 0.4 mg via INTRAVENOUS

## 2018-03-27 LAB — CULTURE-RESP,LOWER W/SENSITIVITY: Lab: LOW (ref 5.0–8.0)

## 2018-03-27 LAB — CULTURE-CATHETER TIP W/SENSITIVITY: Lab: <15 — AB

## 2018-03-28 LAB — CULTURE-BLOOD W/SENSITIVITY

## 2018-04-05 DEATH — deceased

## 2018-04-14 ENCOUNTER — Encounter: Admit: 2018-04-14 | Discharge: 2018-04-14 | Payer: MEDICAID

## 2018-04-14 DIAGNOSIS — I10 Essential (primary) hypertension: ICD-10-CM

## 2018-04-14 DIAGNOSIS — E119 Type 2 diabetes mellitus without complications: ICD-10-CM

## 2018-04-14 DIAGNOSIS — Z720 Tobacco use: ICD-10-CM

## 2018-04-14 DIAGNOSIS — K118 Other diseases of salivary glands: ICD-10-CM

## 2018-04-14 DIAGNOSIS — C3490 Malignant neoplasm of unspecified part of unspecified bronchus or lung: ICD-10-CM

## 2018-04-14 DIAGNOSIS — M549 Dorsalgia, unspecified: ICD-10-CM

## 2018-04-14 DIAGNOSIS — J9611 Chronic respiratory failure with hypoxia: ICD-10-CM

## 2018-04-14 DIAGNOSIS — D3502 Benign neoplasm of left adrenal gland: ICD-10-CM

## 2018-04-14 DIAGNOSIS — J45909 Unspecified asthma, uncomplicated: ICD-10-CM

## 2018-04-14 DIAGNOSIS — G4730 Sleep apnea, unspecified: ICD-10-CM

## 2018-04-14 DIAGNOSIS — Z923 Personal history of irradiation: ICD-10-CM

## 2018-04-14 DIAGNOSIS — G894 Chronic pain syndrome: ICD-10-CM

## 2018-04-14 DIAGNOSIS — R51 Headache: ICD-10-CM

## 2018-04-14 DIAGNOSIS — F319 Bipolar disorder, unspecified: ICD-10-CM

## 2018-04-14 DIAGNOSIS — J449 Chronic obstructive pulmonary disease, unspecified: ICD-10-CM

## 2018-04-14 DIAGNOSIS — J019 Acute sinusitis, unspecified: ICD-10-CM

## 2018-04-14 DIAGNOSIS — G960 Cerebrospinal fluid leak: ICD-10-CM

## 2018-04-14 DIAGNOSIS — H9190 Unspecified hearing loss, unspecified ear: ICD-10-CM

## 2018-04-14 DIAGNOSIS — N2889 Other specified disorders of kidney and ureter: ICD-10-CM

## 2018-04-14 DIAGNOSIS — E041 Nontoxic single thyroid nodule: ICD-10-CM

## 2018-04-14 DIAGNOSIS — M1990 Unspecified osteoarthritis, unspecified site: ICD-10-CM

## 2019-02-26 IMAGING — CR CHEST
2 series · 2 of 2 positions shown · non-contrast
Comparison: none

[chest pa x-wise]
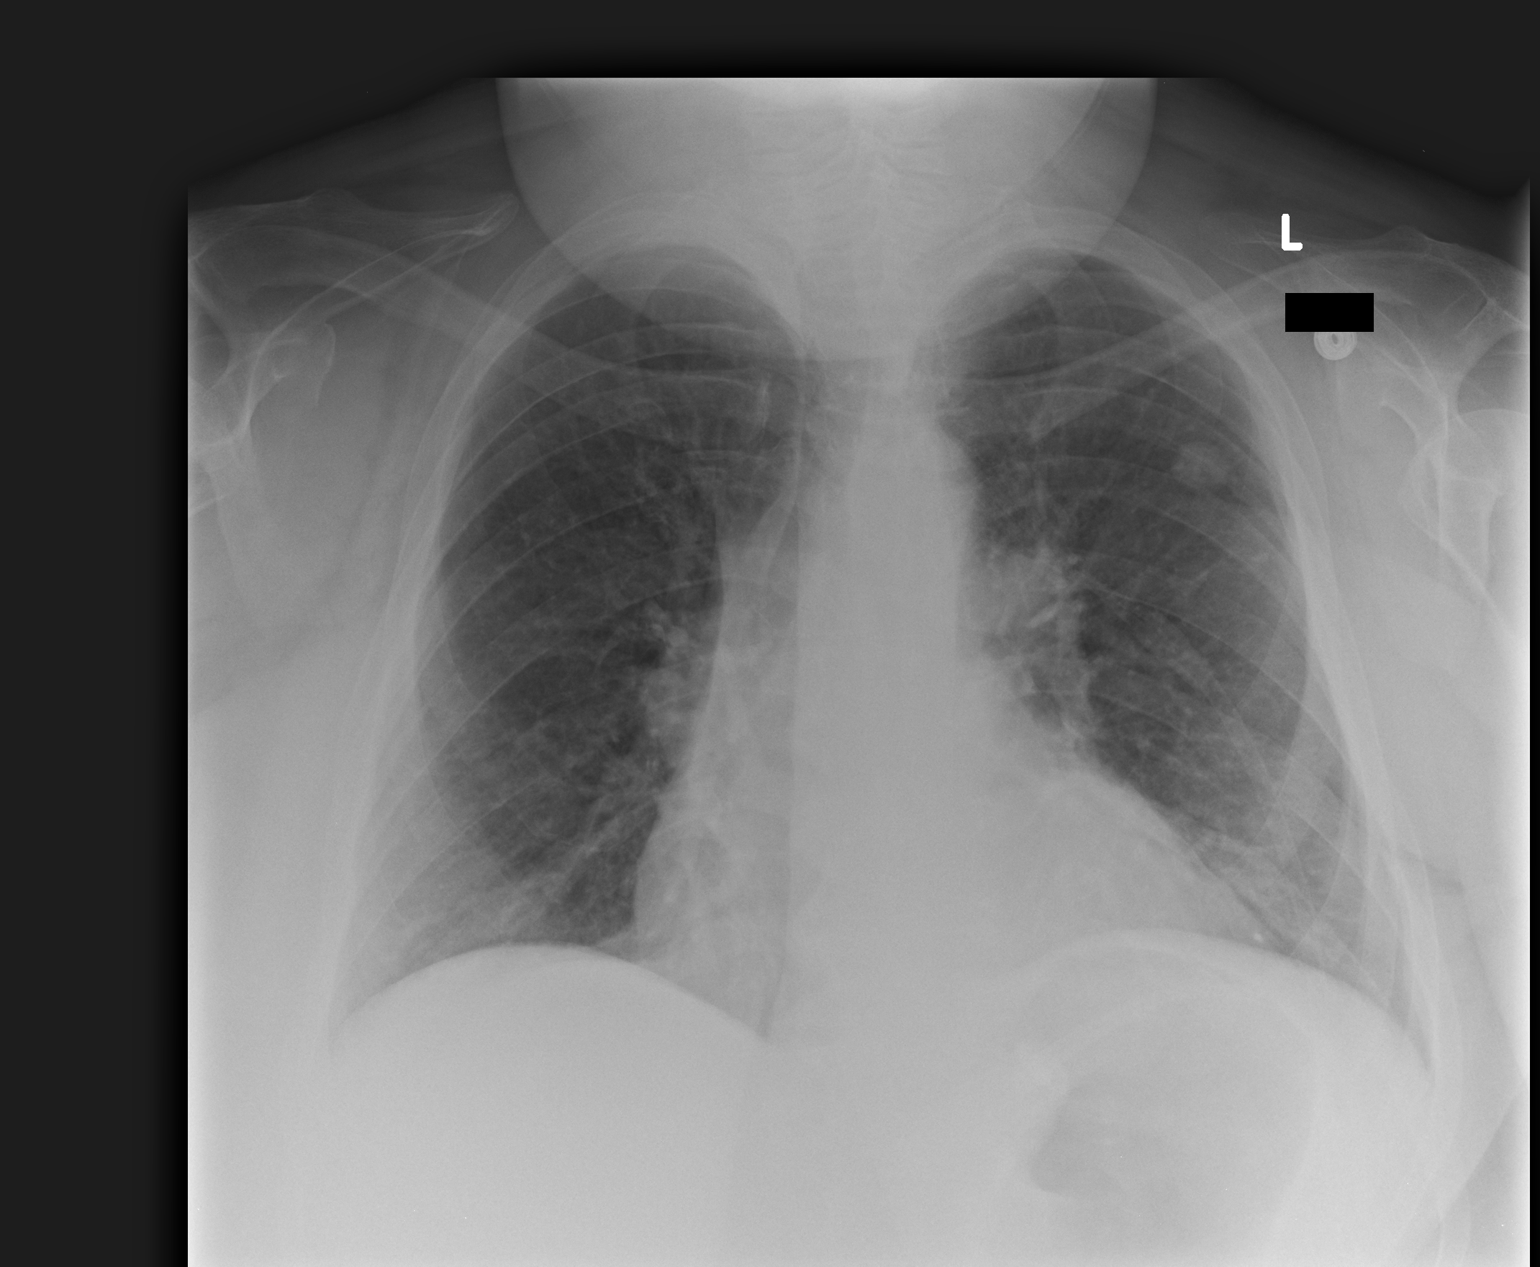

[chest lat]
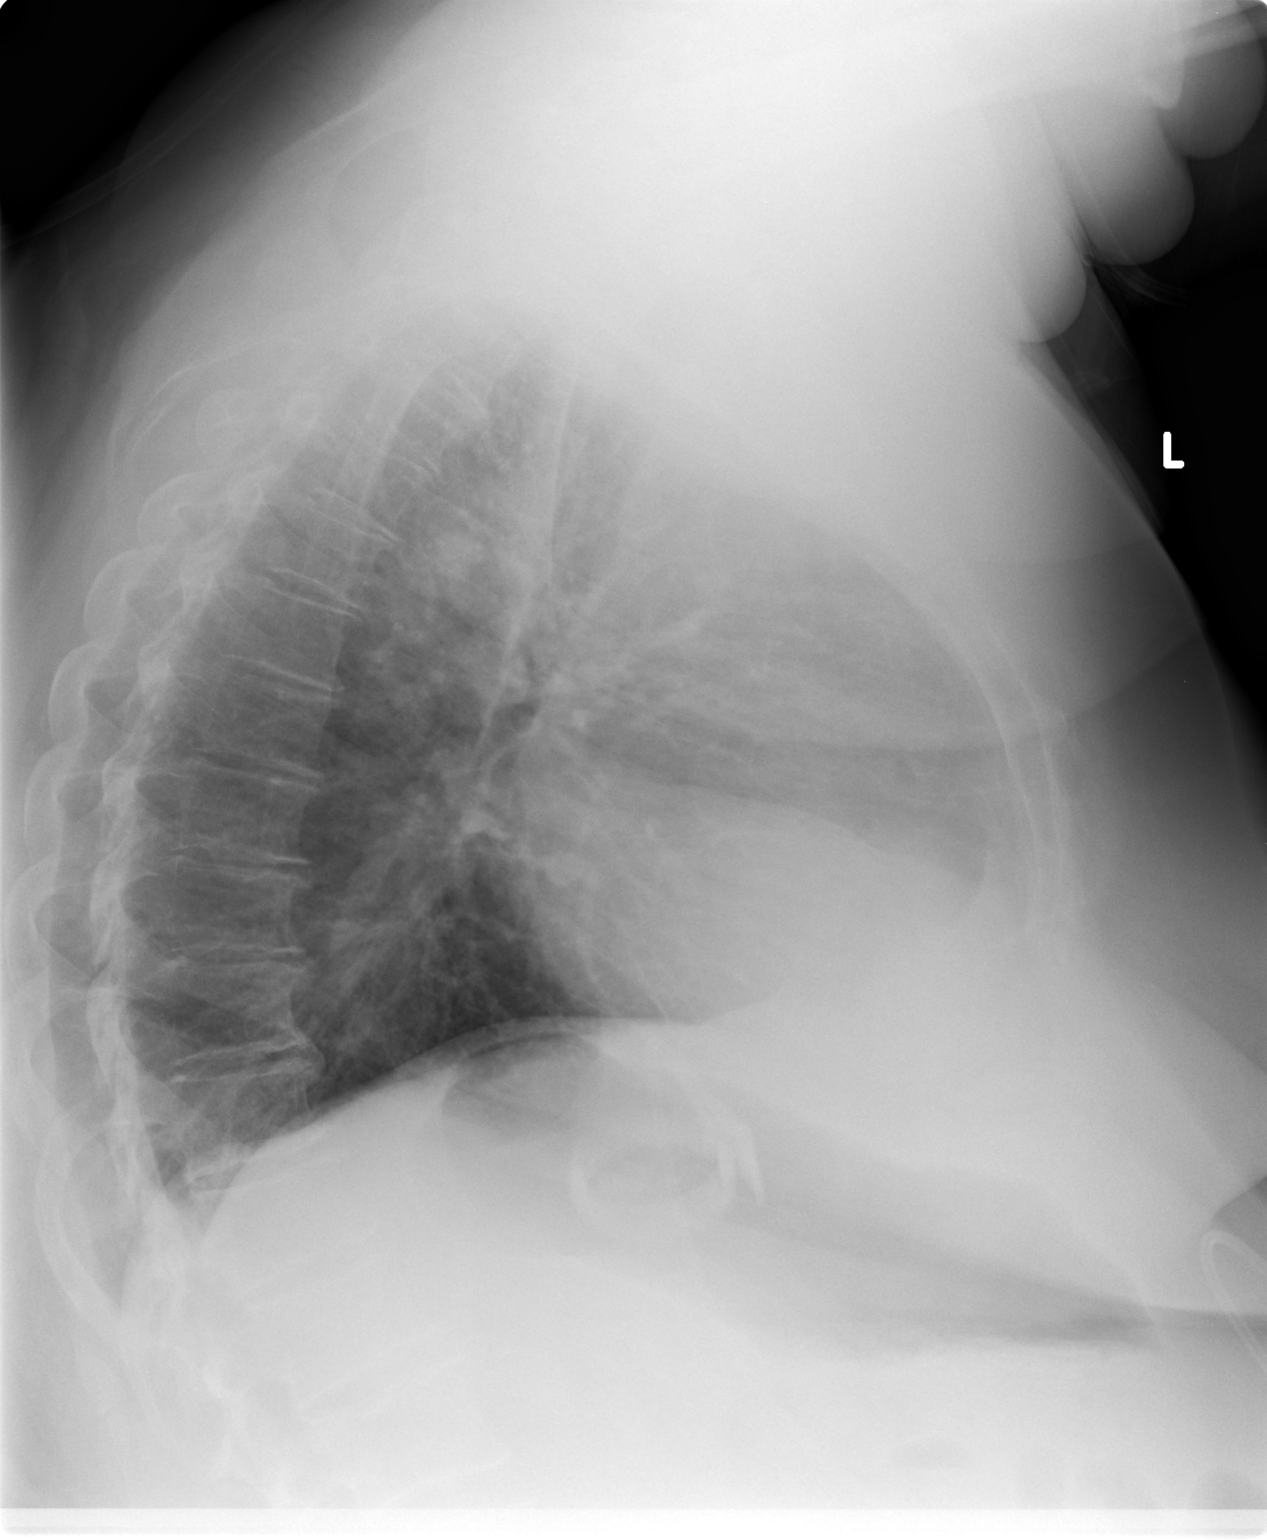

[2 of 2 positions shown; findings below may reference images not displayed]

DIAGNOSTIC STUDIES

EXAM

Chest radiographs.

INDICATION

soa. Hx COPD
soa; hx of diabetes, smoking and copd

TECHNIQUE

PA and lateral chest views.

COMPARISONS

February 09, 2018.

FINDINGS

Calcified pulmonary nodule in the left upper lung. Mildly thickened interstitial markings. No
cardiomegaly. Mild perihilar congestion. There is some hazy alveolar opacity in the lung bases, left
greater than right.

IMPRESSION

Mild ground-glass alveolar opacity in the lung bases. There is likely also mild pulmonary edema.

Tech Notes:

soa; hx of diabetes, smoking and copd
